# Patient Record
Sex: Female | Born: 1950 | Race: White | Hispanic: No | Smoking: Never smoker
Health system: Southern US, Academic
[De-identification: ages and names within clinical notes are randomized; demographics above are authoritative.]

## PROBLEM LIST (undated history)

## (undated) DIAGNOSIS — E785 Hyperlipidemia, unspecified: Secondary | ICD-10-CM

## (undated) DIAGNOSIS — Z974 Presence of external hearing-aid: Secondary | ICD-10-CM

## (undated) DIAGNOSIS — K5900 Constipation, unspecified: Secondary | ICD-10-CM

## (undated) DIAGNOSIS — E039 Hypothyroidism, unspecified: Secondary | ICD-10-CM

## (undated) DIAGNOSIS — E119 Type 2 diabetes mellitus without complications: Secondary | ICD-10-CM

## (undated) DIAGNOSIS — K219 Gastro-esophageal reflux disease without esophagitis: Secondary | ICD-10-CM

## (undated) DIAGNOSIS — M4800 Spinal stenosis, site unspecified: Secondary | ICD-10-CM

## (undated) DIAGNOSIS — Z8669 Personal history of other diseases of the nervous system and sense organs: Secondary | ICD-10-CM

## (undated) DIAGNOSIS — R7303 Prediabetes: Secondary | ICD-10-CM

## (undated) HISTORY — PX: HX WISDOM TEETH EXTRACTION: SHX21

## (undated) HISTORY — DX: Hyperlipidemia, unspecified: E78.5

## (undated) HISTORY — DX: Prediabetes: R73.03

## (undated) HISTORY — DX: Spinal stenosis, site unspecified: M48.00

## (undated) HISTORY — DX: Hypothyroidism, unspecified: E03.9

---

## 1994-12-27 ENCOUNTER — Other Ambulatory Visit (HOSPITAL_COMMUNITY): Payer: Self-pay | Admitting: OBSTETRICS/GYNECOLOGY

## 2007-04-10 IMAGING — MG MAMMO SCREEN W CAD
1 series · 4 of 4 positions shown · non-contrast
Comparison: Previous exams of 04/08/06 and 03/26/05.

Charlemagne, Walter Alexis
HISTORY: Asymptomatic 56 year-old with family history of breast cancer in her mother. Calculated lifetime breast cancer risk in this patient is 16% compared with 10% for control group.

[Series 2: R CC · right · 4 of 4 slices shown]
[im 1/4]
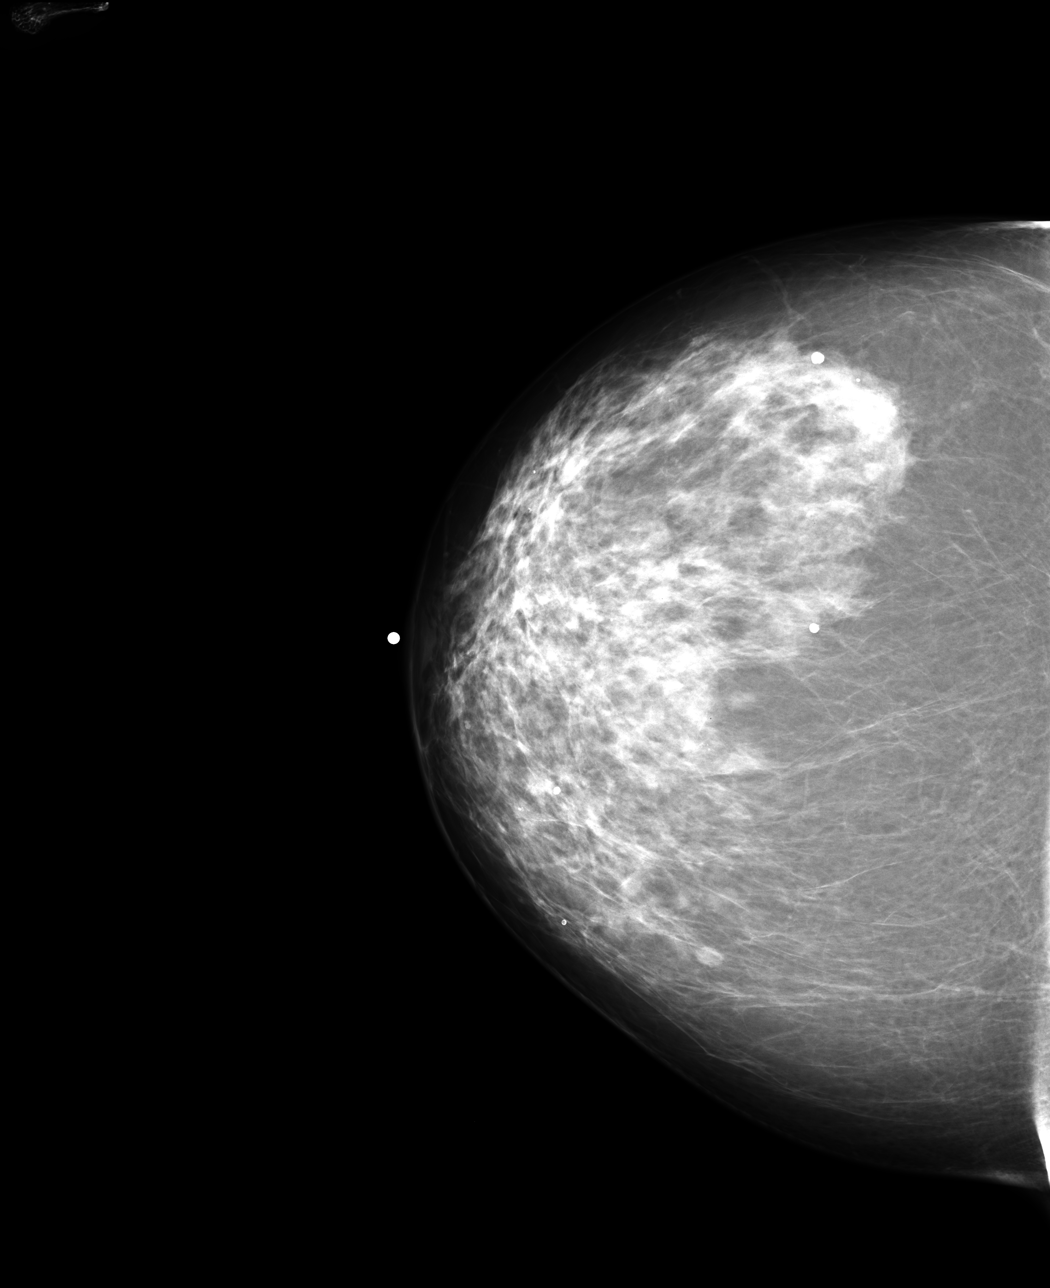
[im 2/4]
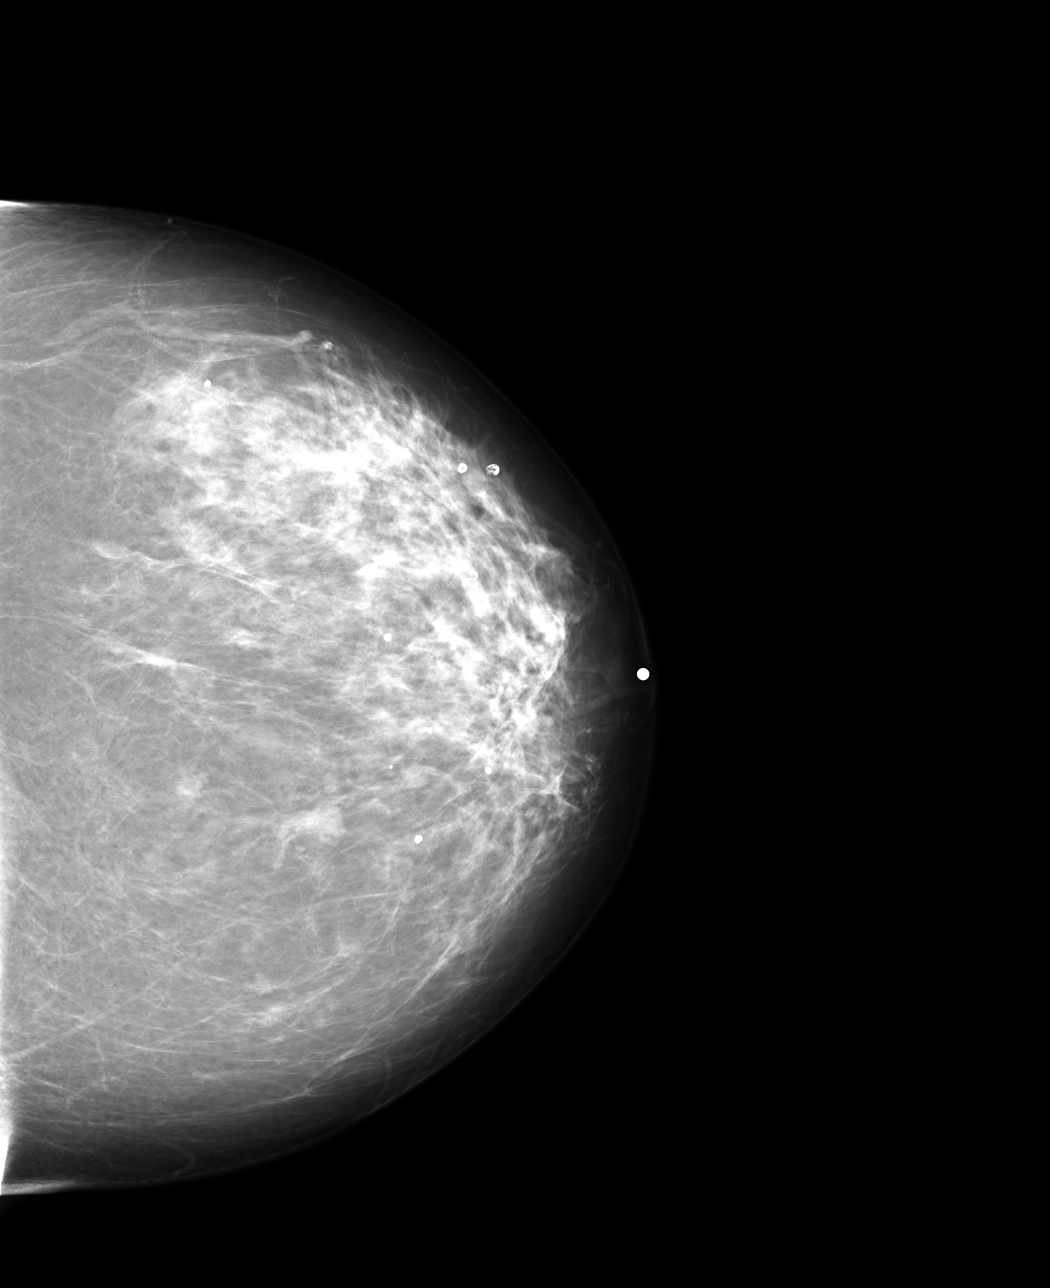
[im 3/4]
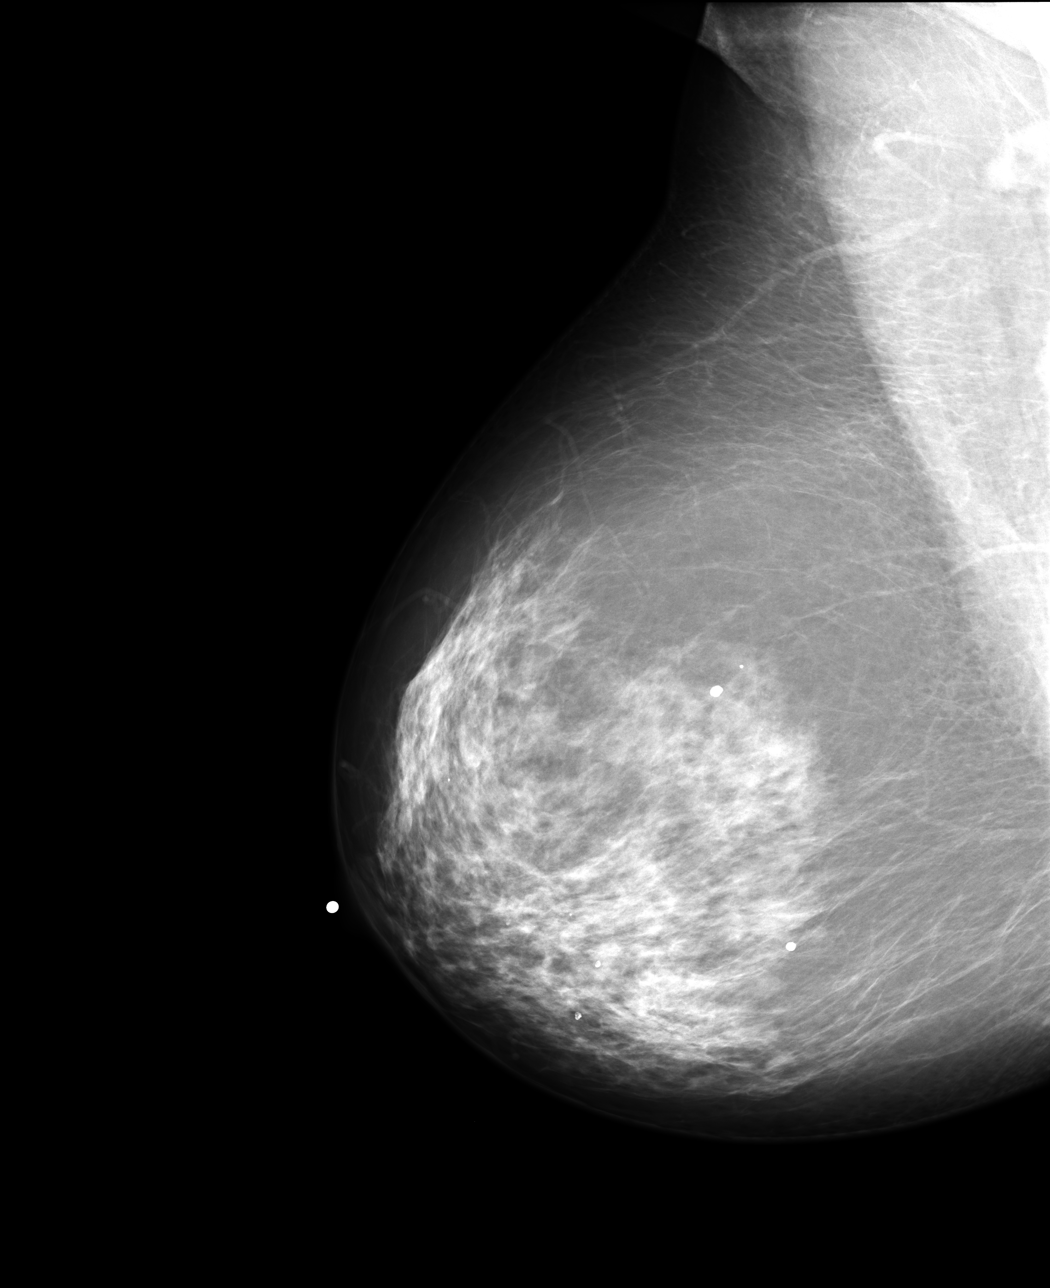
[im 4/4]
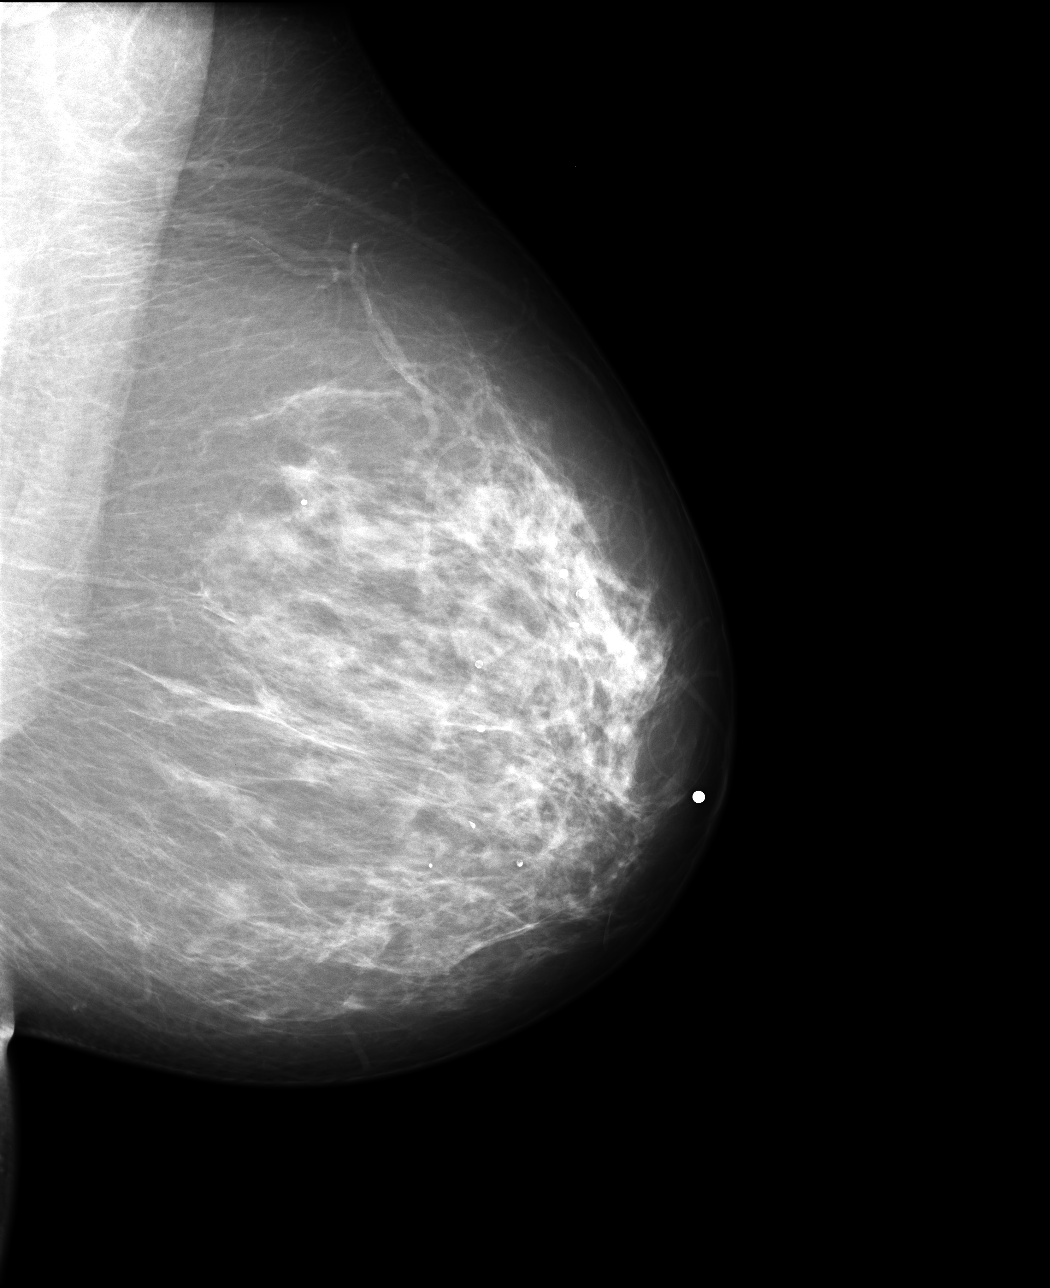

[4 of 4 positions shown; findings below may reference images not displayed]

FINDINGS: No well defined masses or architectural changes are seen. Benign calcific densities of both breasts are stable in appearance. No skin changes or nipple changes are seen. 
NOTE:
In compliance with Federal regulations, the results of this mammogram are being sent to the patient.
IMPRESSION: Stable mammographic findings including scattered microcalcifications in both breasts. 
Clinical follow up and mammographic follow up at twelve months are recommended. 

Final Assessment Code:
Bi-Rads 2 

BI-RADS 0
Need additional imaging evaluation
BI-RADS 1
Negative mammogram
BI-RADS 2
Benign finding
BI-RADS 3
Probably benign finding: short-interval follow-up suggested
BI-RADS 4
Suspicious abnormality:  biopsy should be considered
BI-RADS 5
Highly suggestive of malignancy; appropriate action should be taken

________________________________
Junihato Grandson., signed this document electronically

## 2008-04-10 IMAGING — MG MAMMO SCREEN W CAD
1 series · 4 of 4 positions shown · non-contrast
Comparison: none

Achurra, Patricioo
HISTORY: Screening.
TECHNIQUE: Digital screening mammography was performed with CAD.

[Series 2: R CC · right · 4 of 4 slices shown]
[im 1/4]
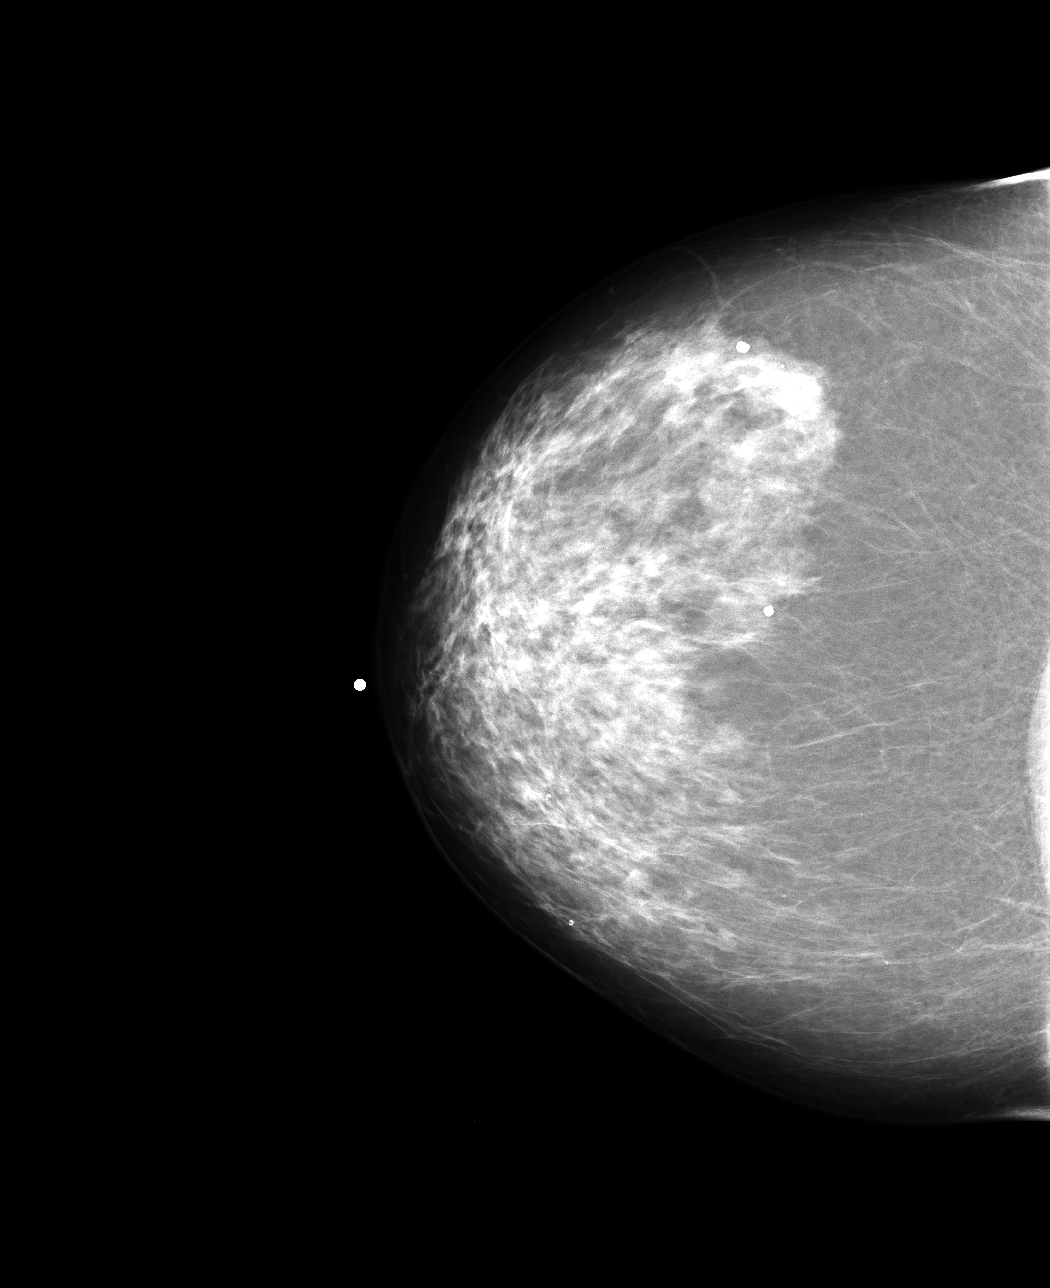
[im 2/4]
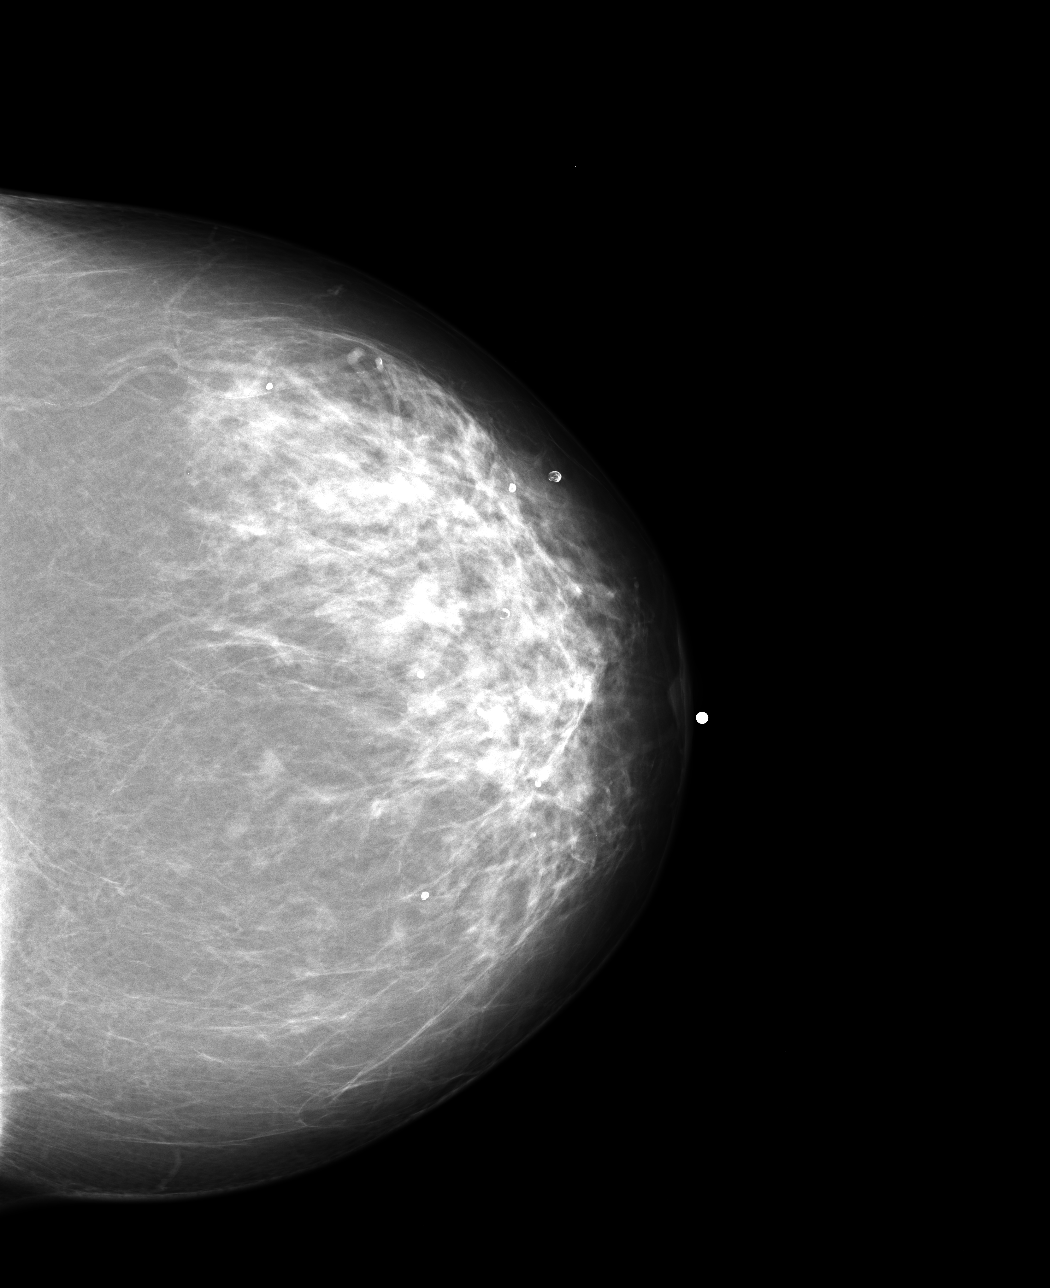
[im 3/4]
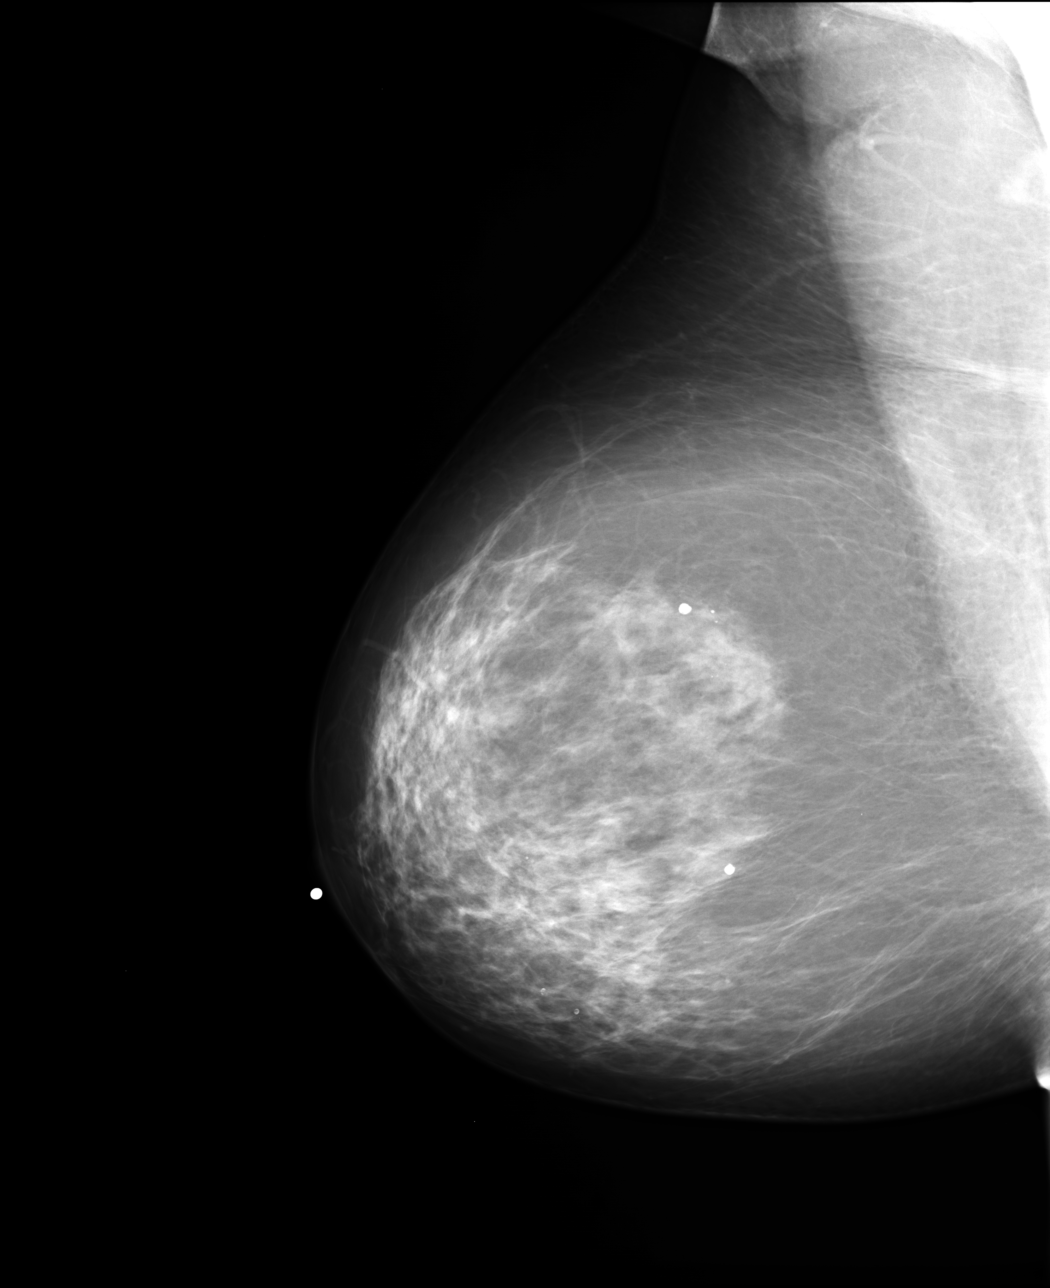
[im 4/4]
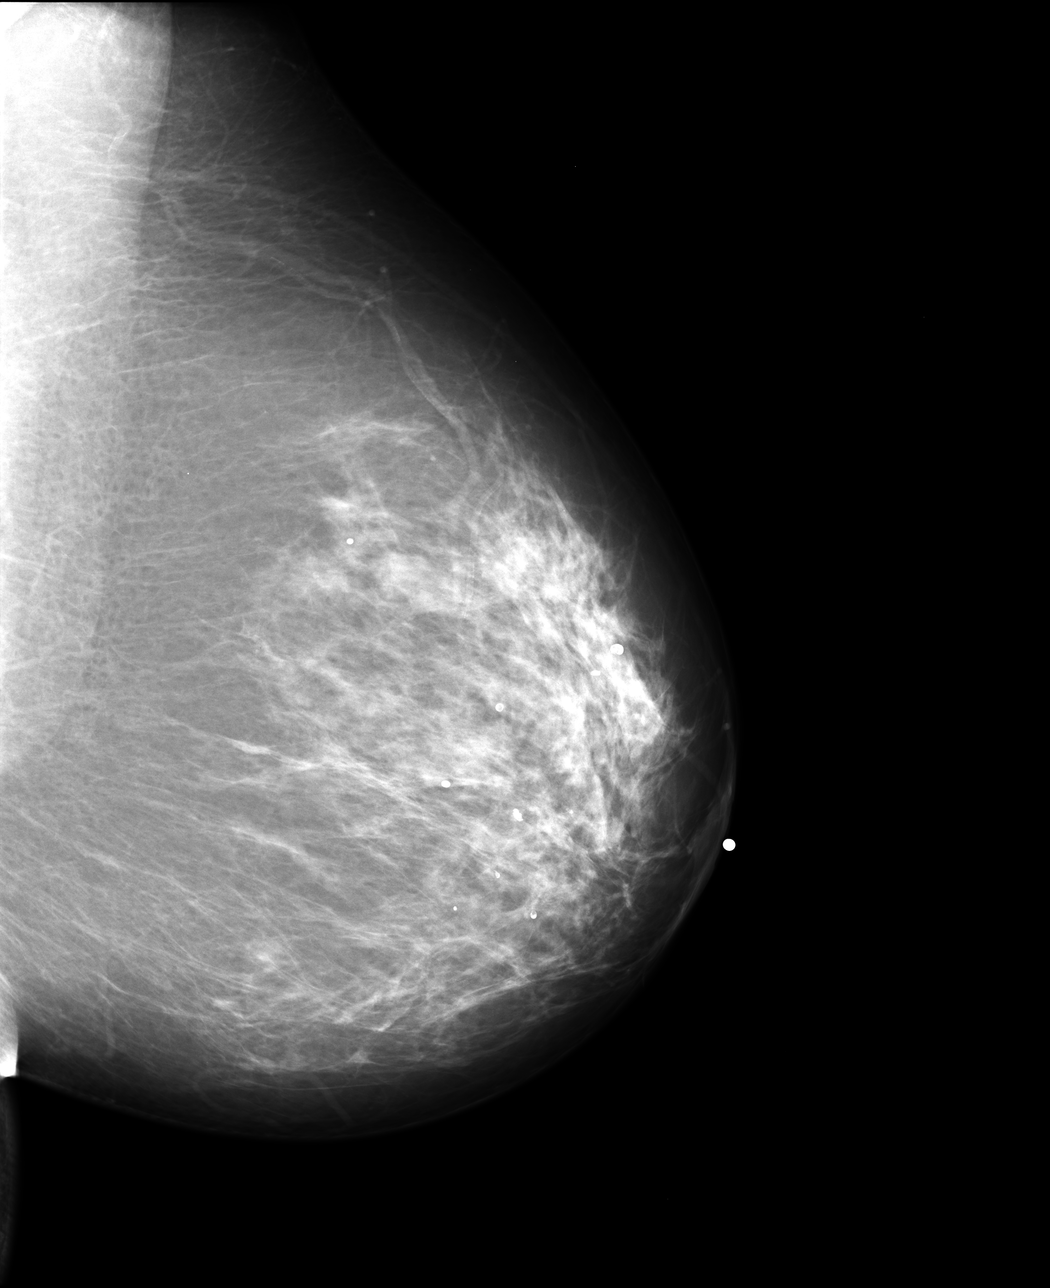

[4 of 4 positions shown; findings below may reference images not displayed]

FINDINGS: There is a moderate amount of heterogeneous parenchyma seen in a roughly symmetrical distribution.  Benign-appearing calcifications are noted bilaterally.  No malignant-appearing masses, spiculated lesions, or suspicious microcalcifications are seen.  No significant change is evident compared to prior films dated 04/10/2007.

NOTE:
In compliance with Federal regulations, the results of this mammogram are being sent to the patient.
IMPRESSION: No radiographic evidence of malignancy

Final Assessment Code:  BI-RADS 2-Benign.

BI-RADS 0
Need additional imaging evaluation
BI-RADS 1
Negative mammogram
BI-RADS 2
Benign finding
BI-RADS 3
Probably benign finding - short interval follow-up suggested
BI-RADS 4
Suspicious abnormality: biopsy should be considered
BI-RADS 5
Highly suggestive of malignancy; appropriate action should be taken

________________________________

## 2009-04-11 IMAGING — MG MAMMO SCREEN W CAD
1 series · 4 of 4 positions shown · non-contrast
Comparison: none

Atenas, Katherine Patricia

EXAM:
DIGITAL SCREENING BILATERAL MAMMOGRAM WITHOUT CONTRAST
HISTORY: Screening.
TECHNIQUE: Digital screening mammography was performed with CAD.

[Series 2: R CC · right · 4 of 4 slices shown]
[im 1/4]
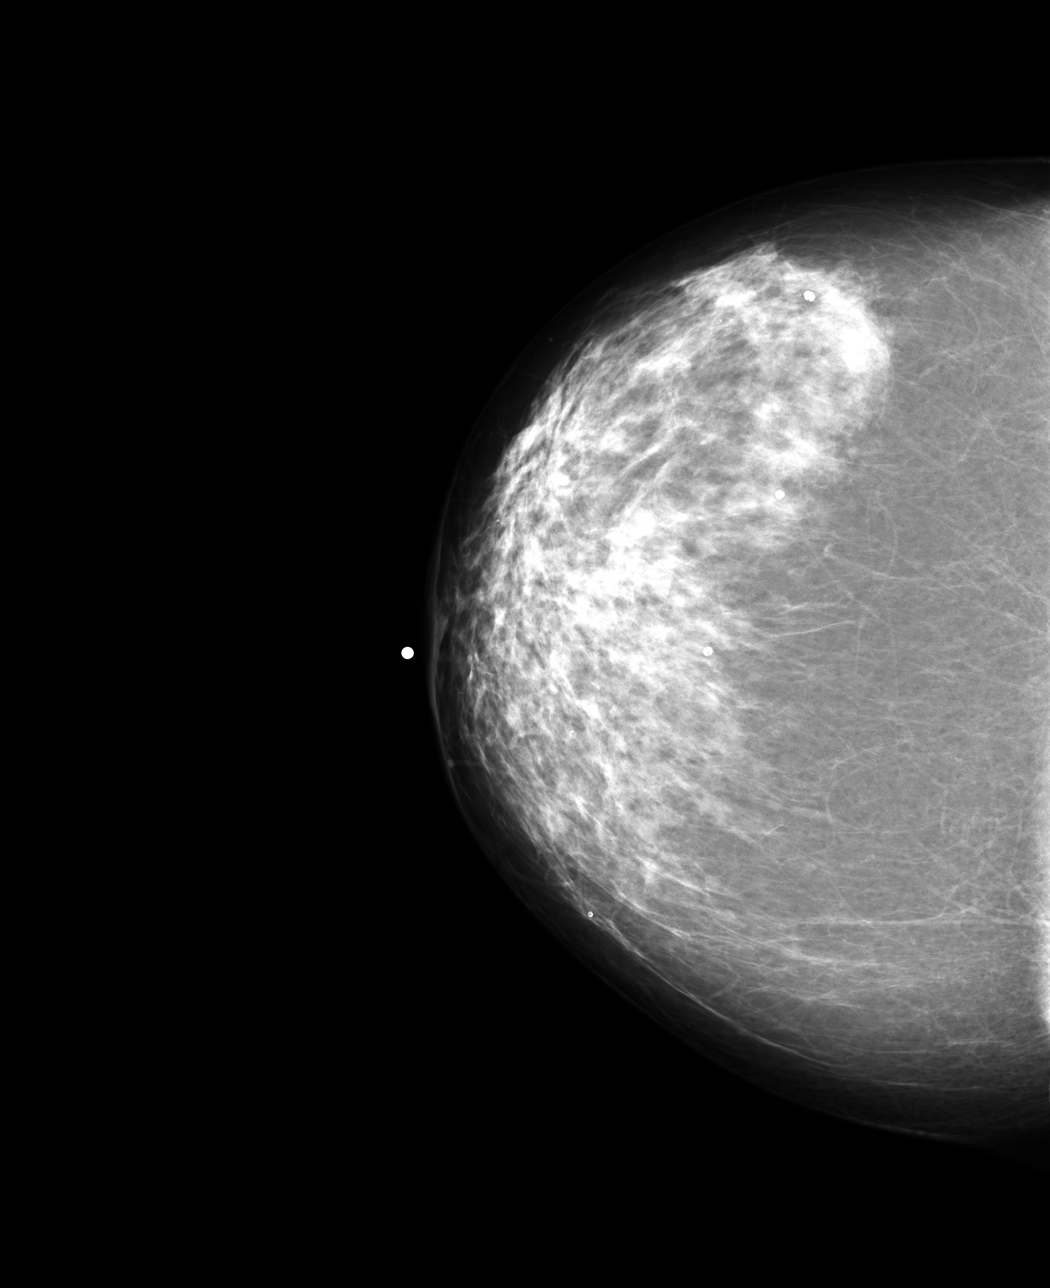
[im 2/4]
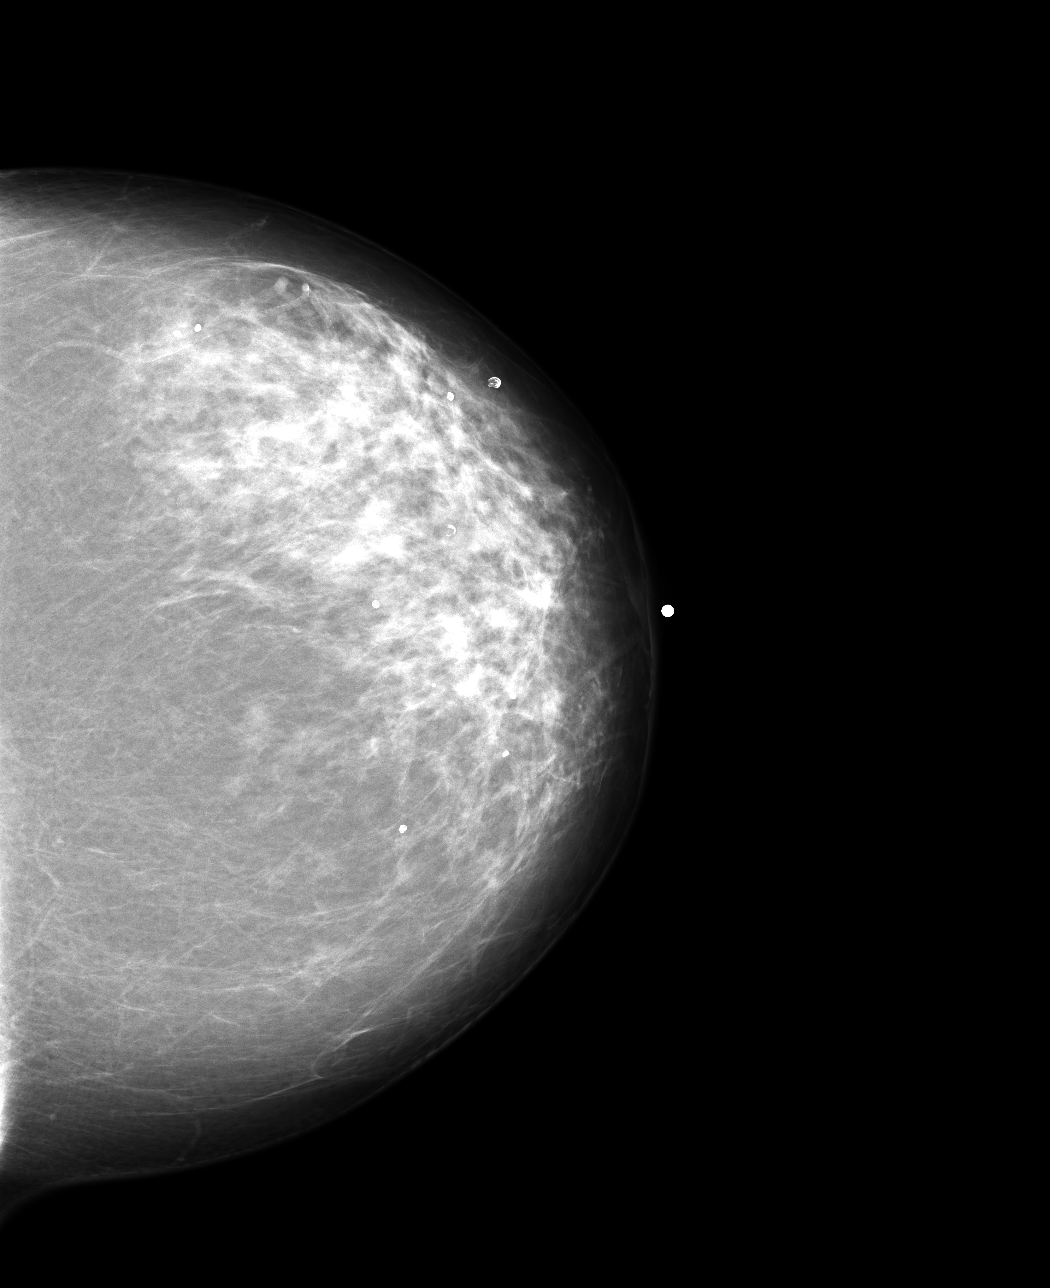
[im 3/4]
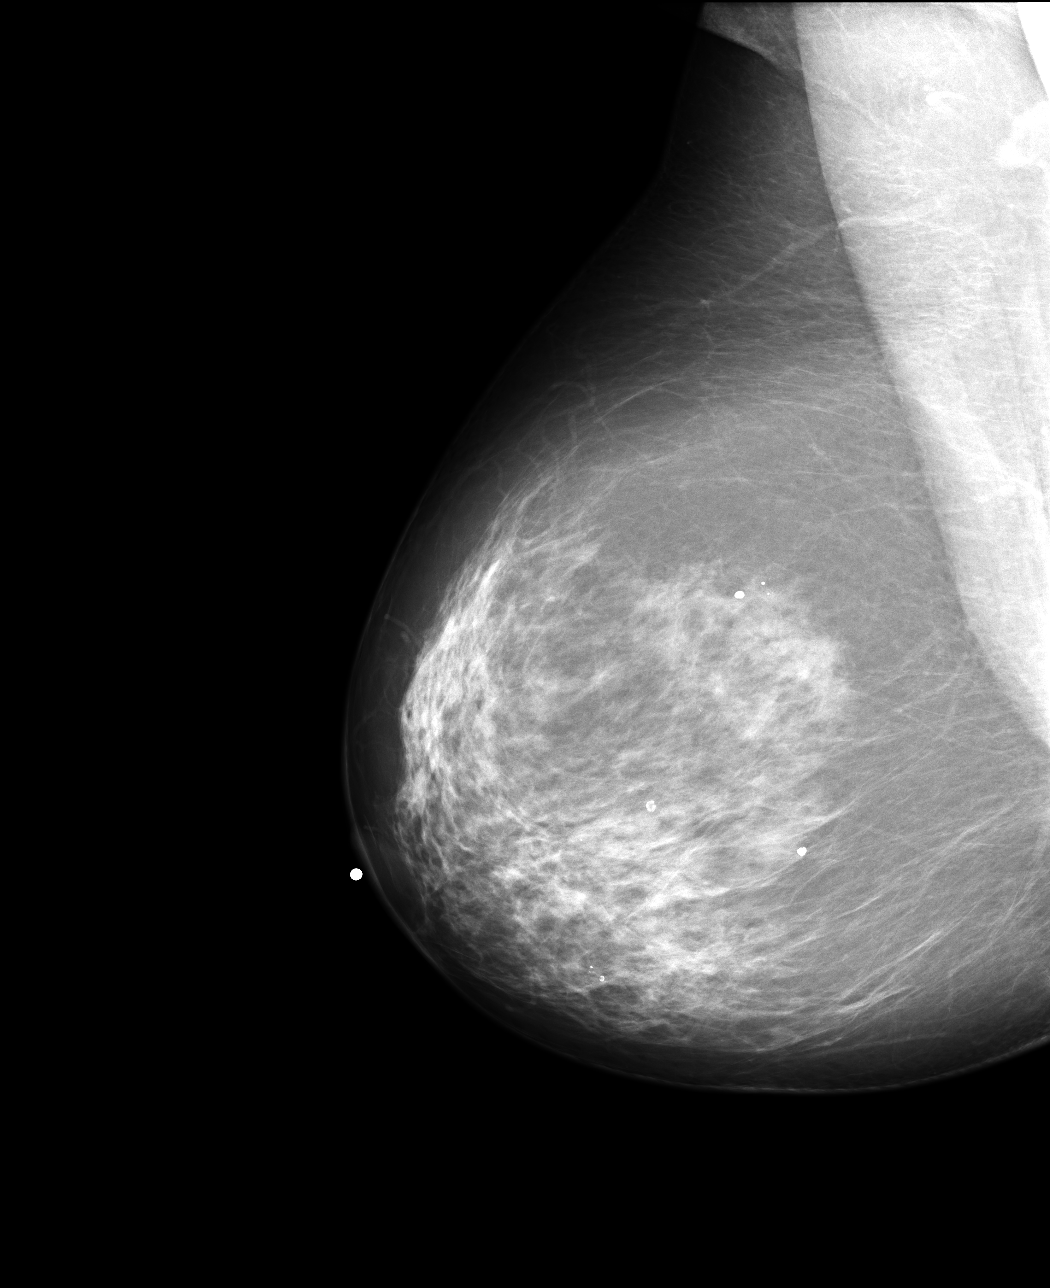
[im 4/4]
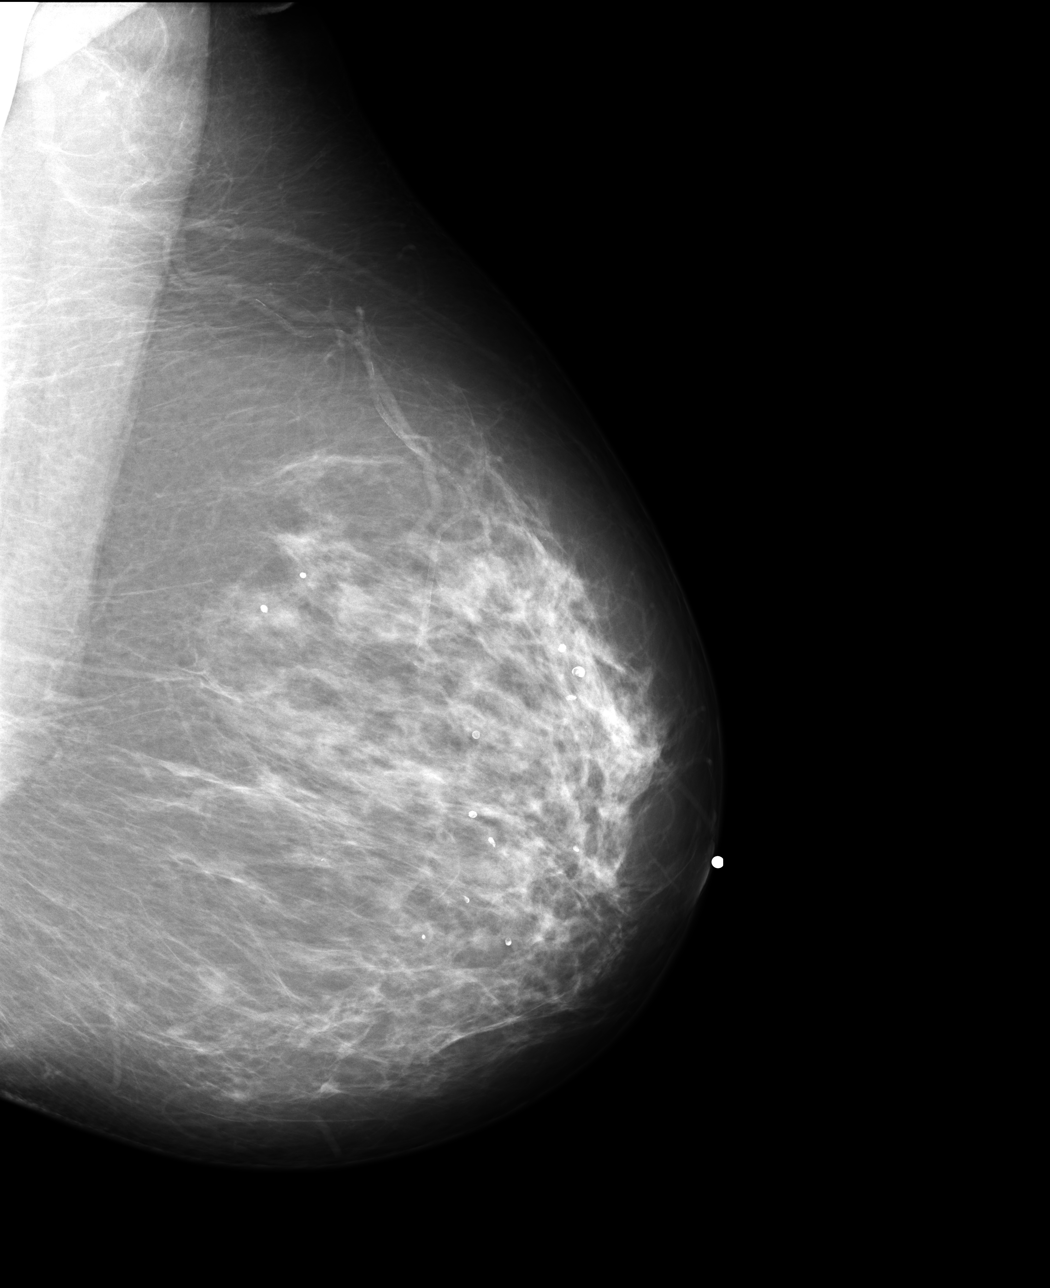

[4 of 4 positions shown; findings below may reference images not displayed]

FINDINGS: There is a moderate amount of heterogeneous parenchyma seen in a roughly symmetrical distribution.  Benign-appearing calcifications are noted bilaterally.  No malignant-appearing masses, spiculated lesions, or suspicious microcalcifications are seen.  No significant change is evident compared to prior films dated 04/10/2008.    

NOTE:

In compliance with Federal regulations, the results of this mammogram are being sent to the patient.
IMPRESSION: No radiographic evidence of malignancy.

Final Assessment Code:

BI-RADS 2:
BI-RADS 0
Need additional imaging evaluation
BI-RADS 1
Negative mammogram
BI-RADS 2
Benign finding
BI-RADS 3
Probably benign finding - short interval follow-up suggested
BI-RADS 4
Suspicious abnormality: biopsy should be considered
BI-RADS 5
Highly suggestive of malignancy; appropriate action should be taken

________________________________

## 2010-04-16 IMAGING — MG MAMMO SCREEN W CAD
1 series · 4 of 4 positions shown · non-contrast
Comparison: 04/10/08 and 04/11/09.

Ratamaa, Erkki
HISTORY: Asymptomatic 59-year old female with family history of breast cancer in her mother.  Lifetime breast cancer risk is calculated at 15% compared with 10% in controlled group.

[Series 2: R CC · right · 4 of 4 slices shown]
[im 1/4]
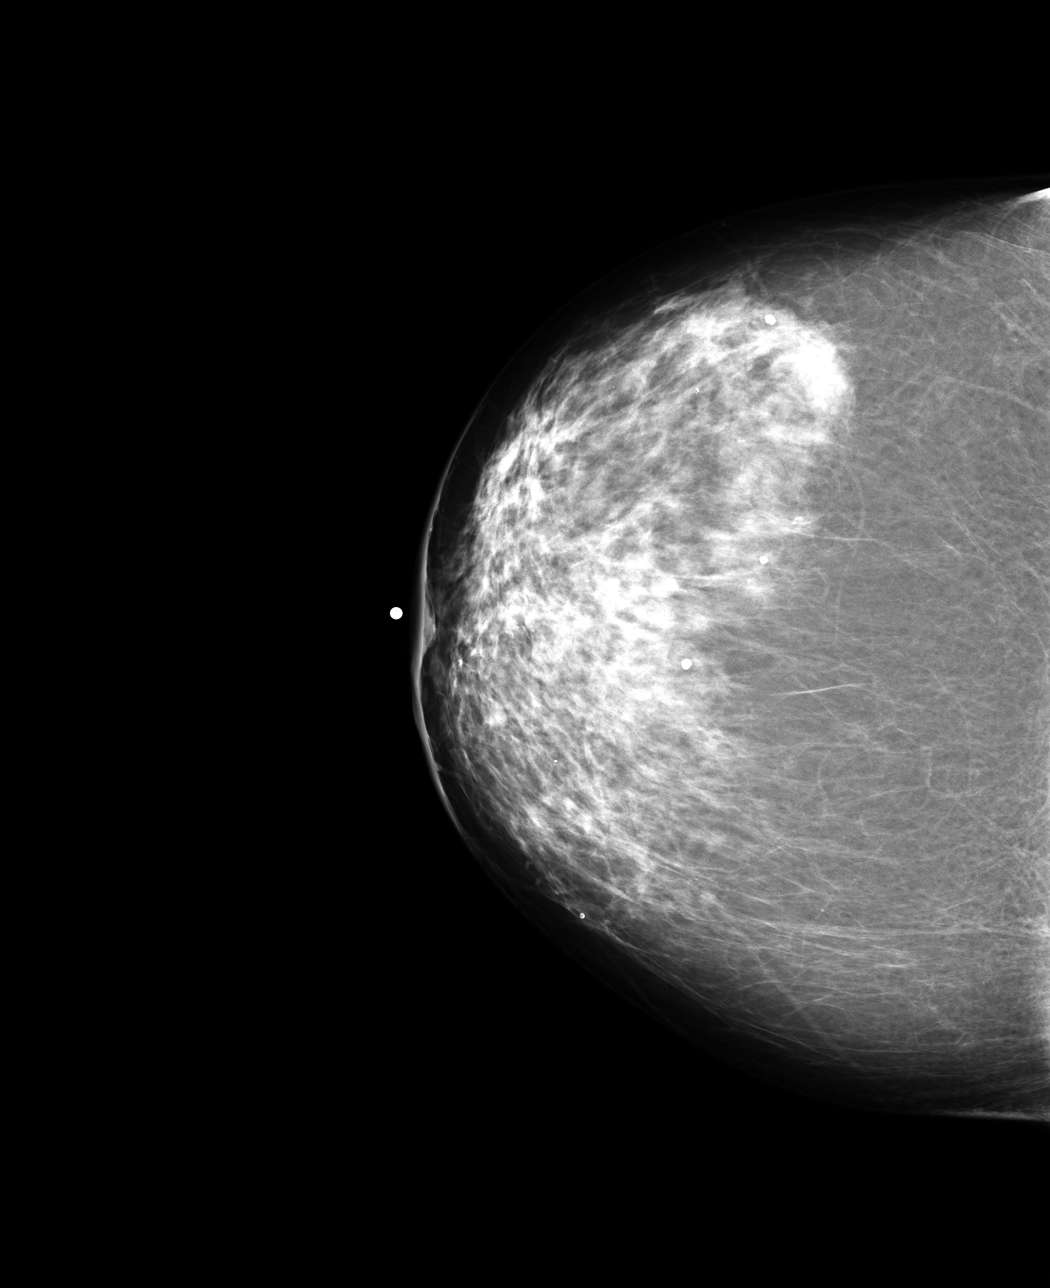
[im 2/4]
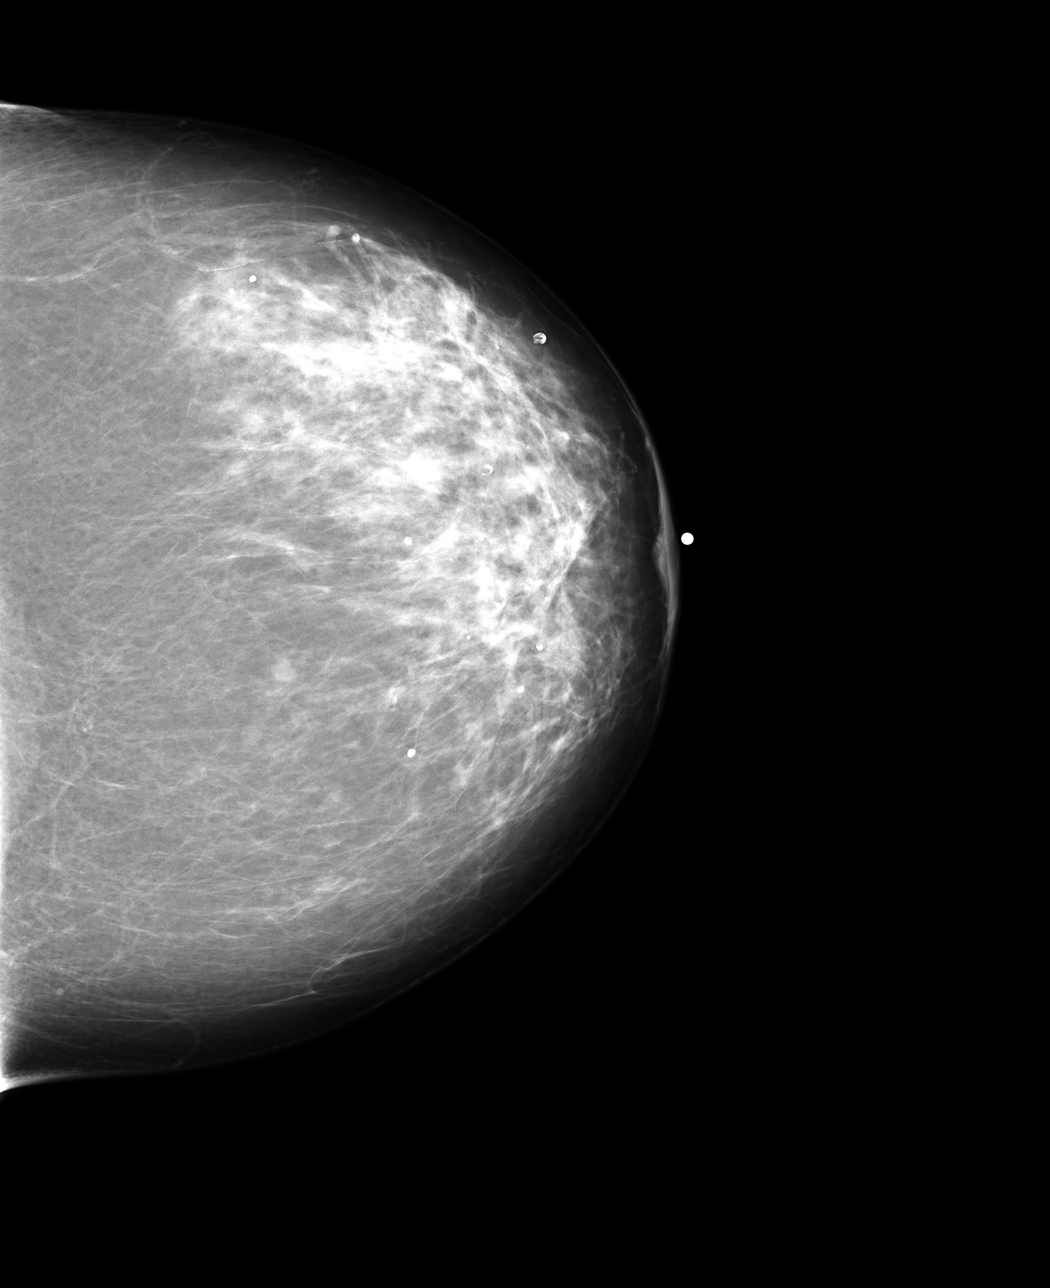
[im 3/4]
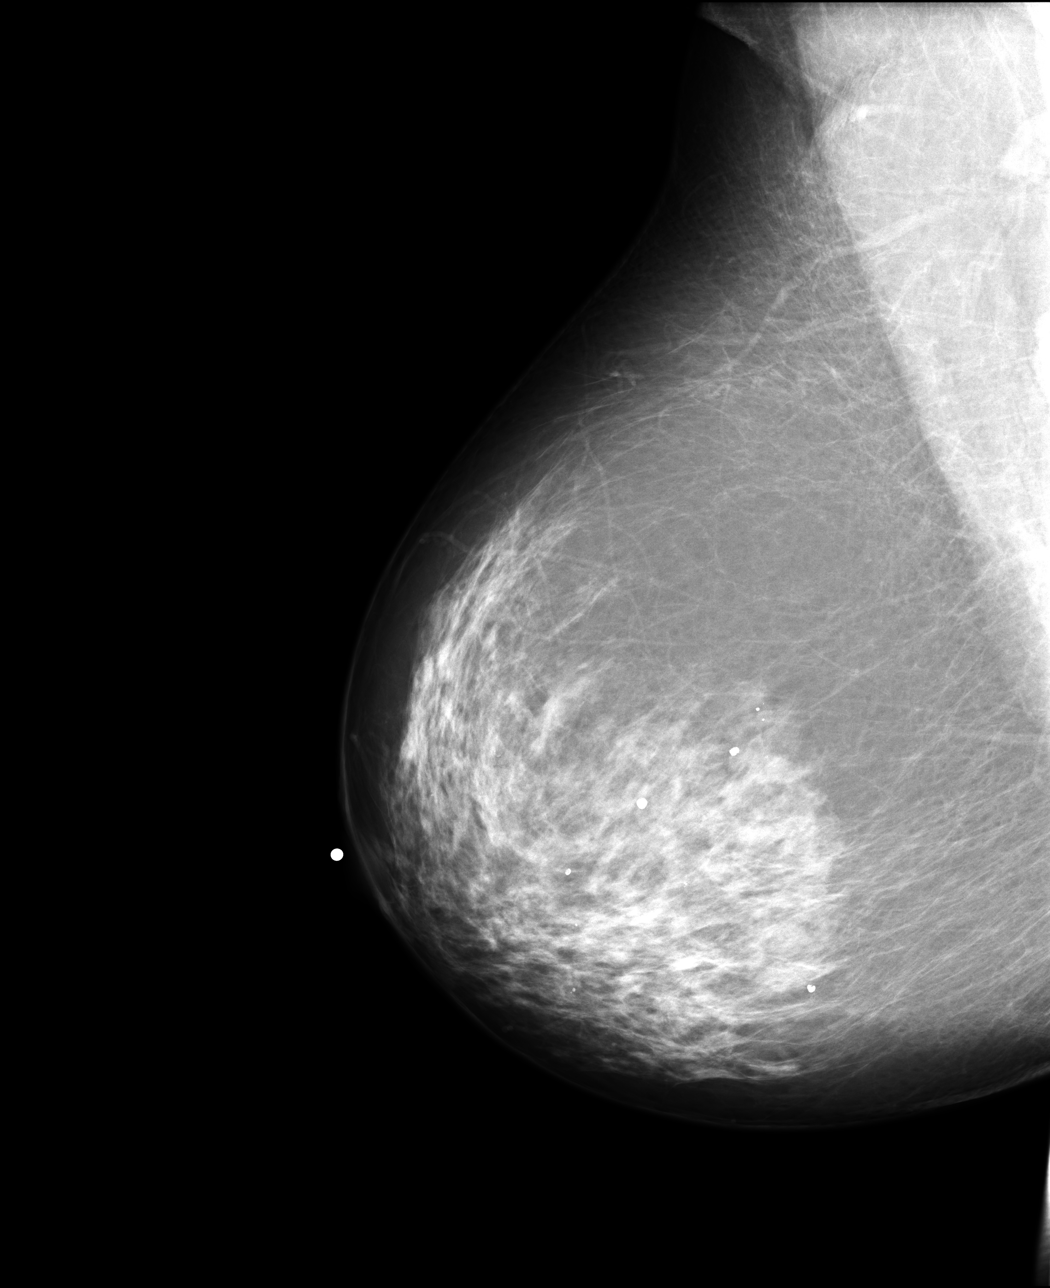
[im 4/4]
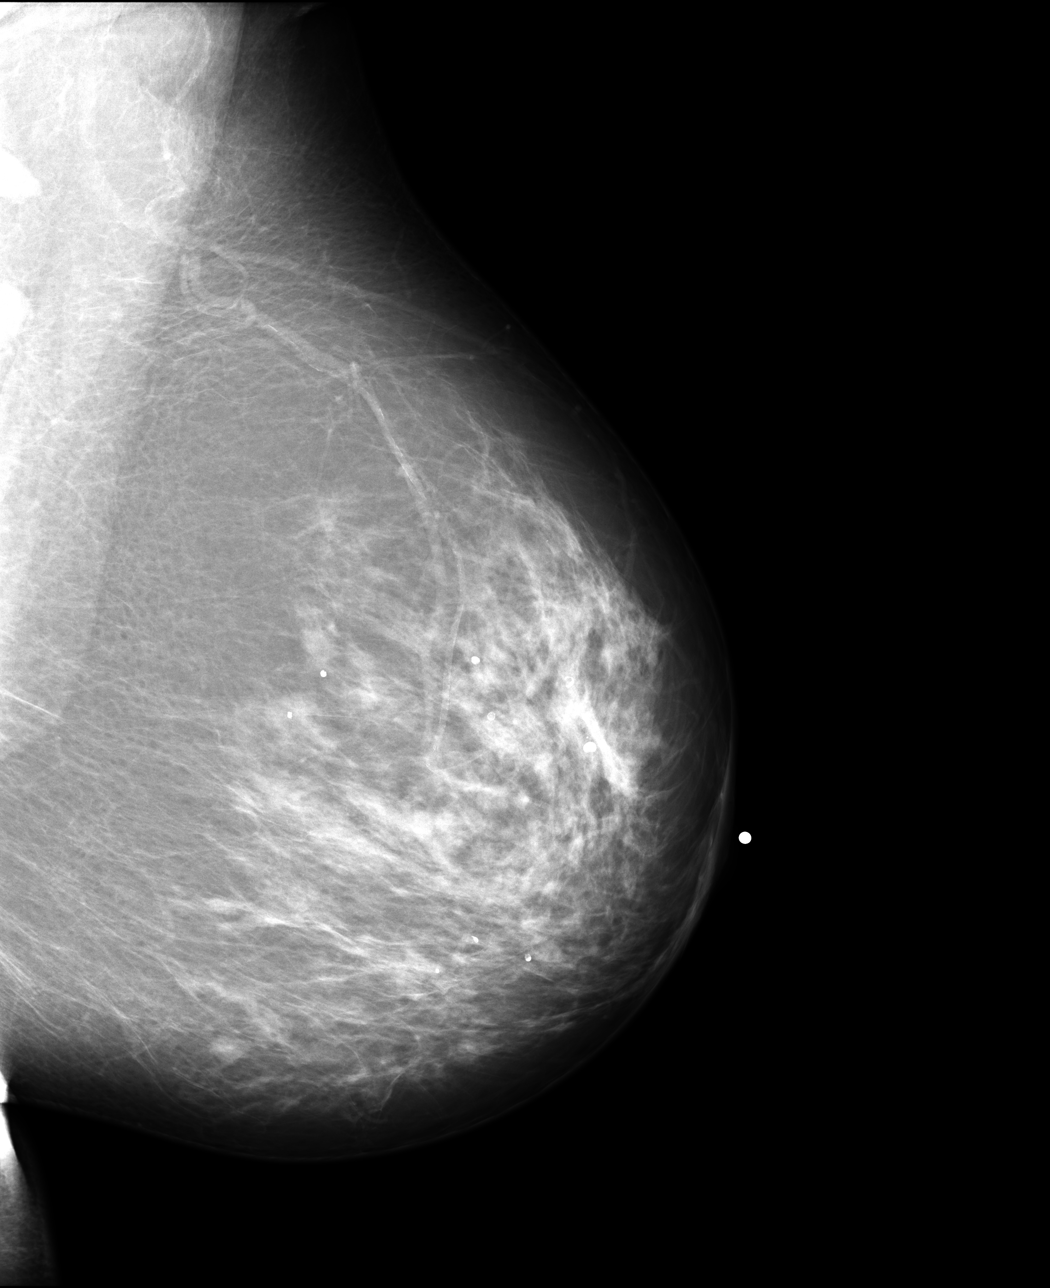

[4 of 4 positions shown; findings below may reference images not displayed]

FINDINGS: No focal masses are seen.  Scattered microcalcific foci of both breasts are stable.  Small focal asymmetry in the upper lateral right breast is also stable in appearance.  No skin changes or nipple changes are seen.  
NOTE:
In compliance with Federal regulations, the results of this mammogram are being sent to the patient.
IMPRESSION: 1. Stable mammographic findings including benign microcalcific foci of both breasts and a small focal asymmetry in the lateral right breast.  Clinical and mammographic follow up are indicated at 12 months.  
Final Assessment Code:
Bi-Rads 2 

BI-RADS 0
Need additional imaging evaluation
BI-RADS 1
Negative mammogram
BI-RADS 2
Benign finding
BI-RADS 3
Probably benign finding: short-interval follow-up suggested
BI-RADS 4
Suspicious abnormality:  biopsy should be considered
BI-RADS 5
Highly suggestive of malignancy; appropriate action should be taken

________________________________
Pere Antoni Mazon., signed this document electronically

## 2011-04-20 IMAGING — MG MAMMO SCREEN W CAD
1 series · 4 of 4 positions shown · non-contrast
Comparison: Mammography from March 2010 and March 2009.

Ayuso, Juvencio

Exam:
Screening digital mammogram with CAD
INDICATION: Annual.

[Series 2: R CC · right · 4 of 4 slices shown]
[im 1/4]
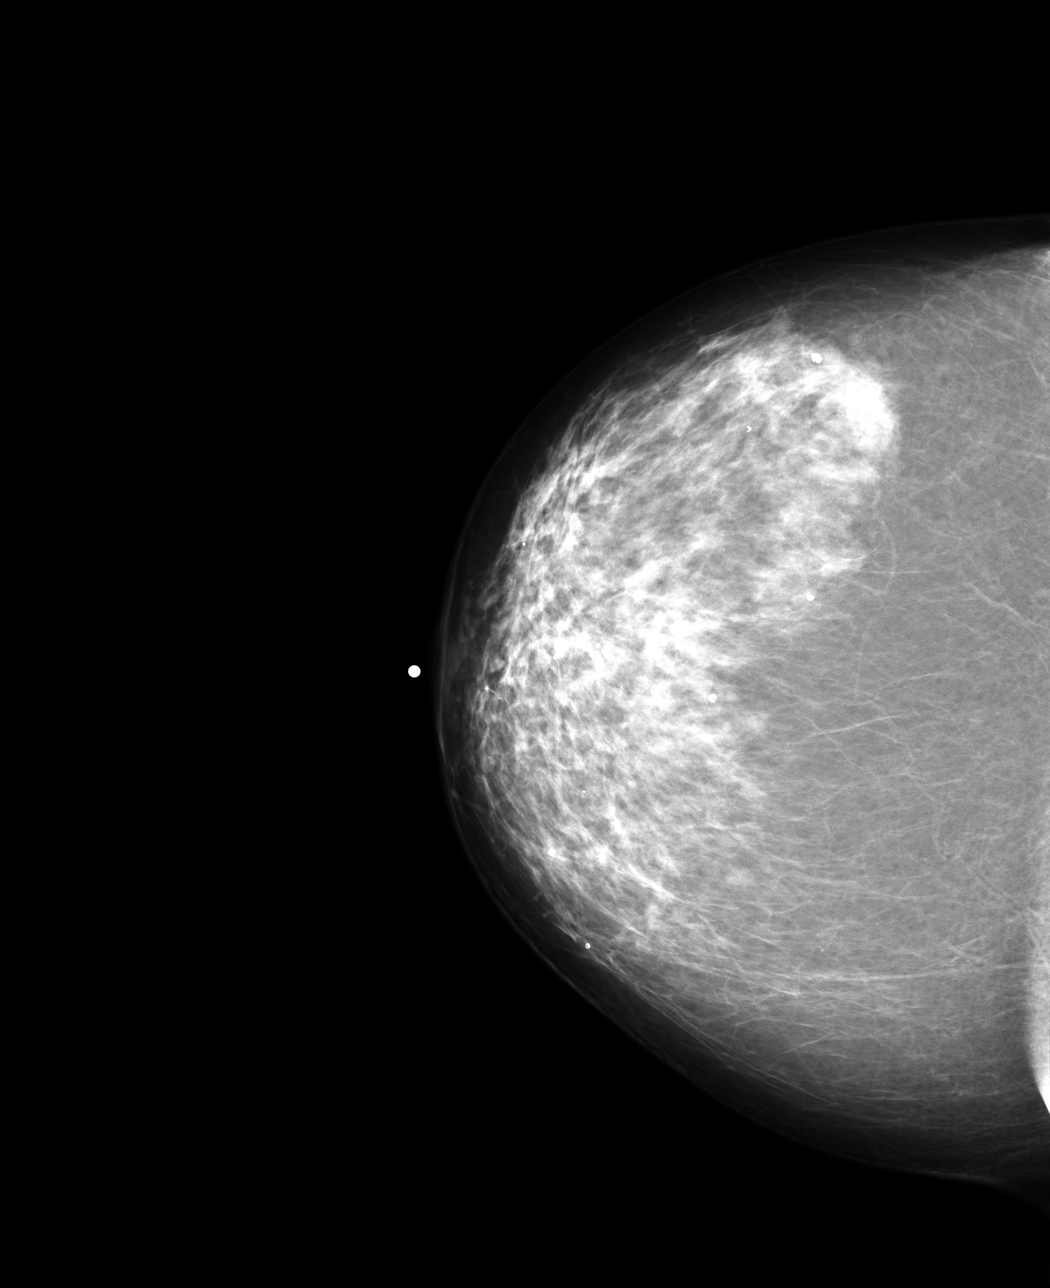
[im 2/4]
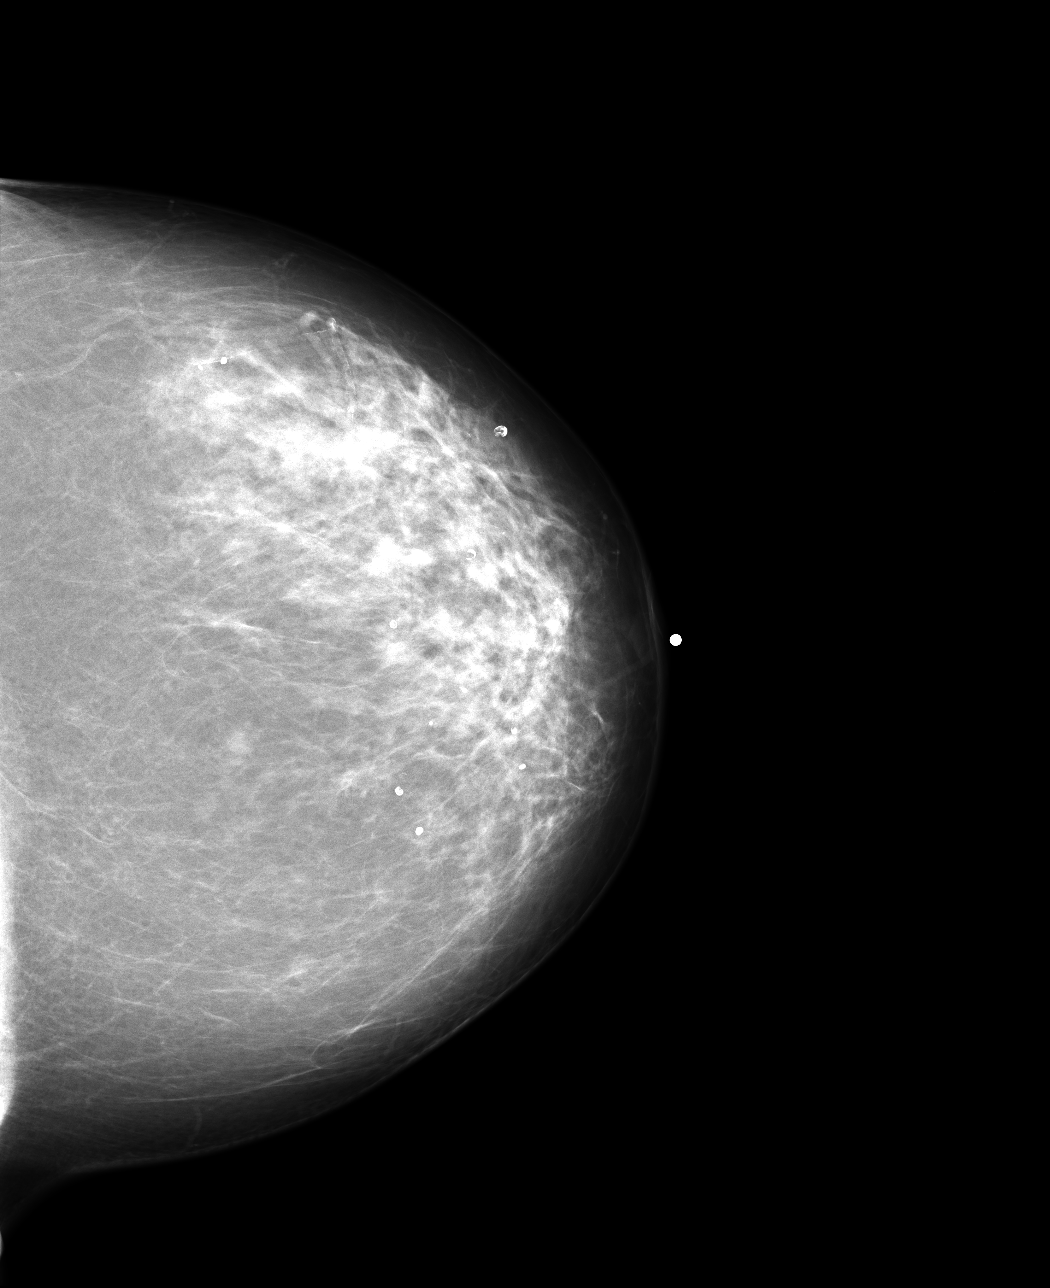
[im 3/4]
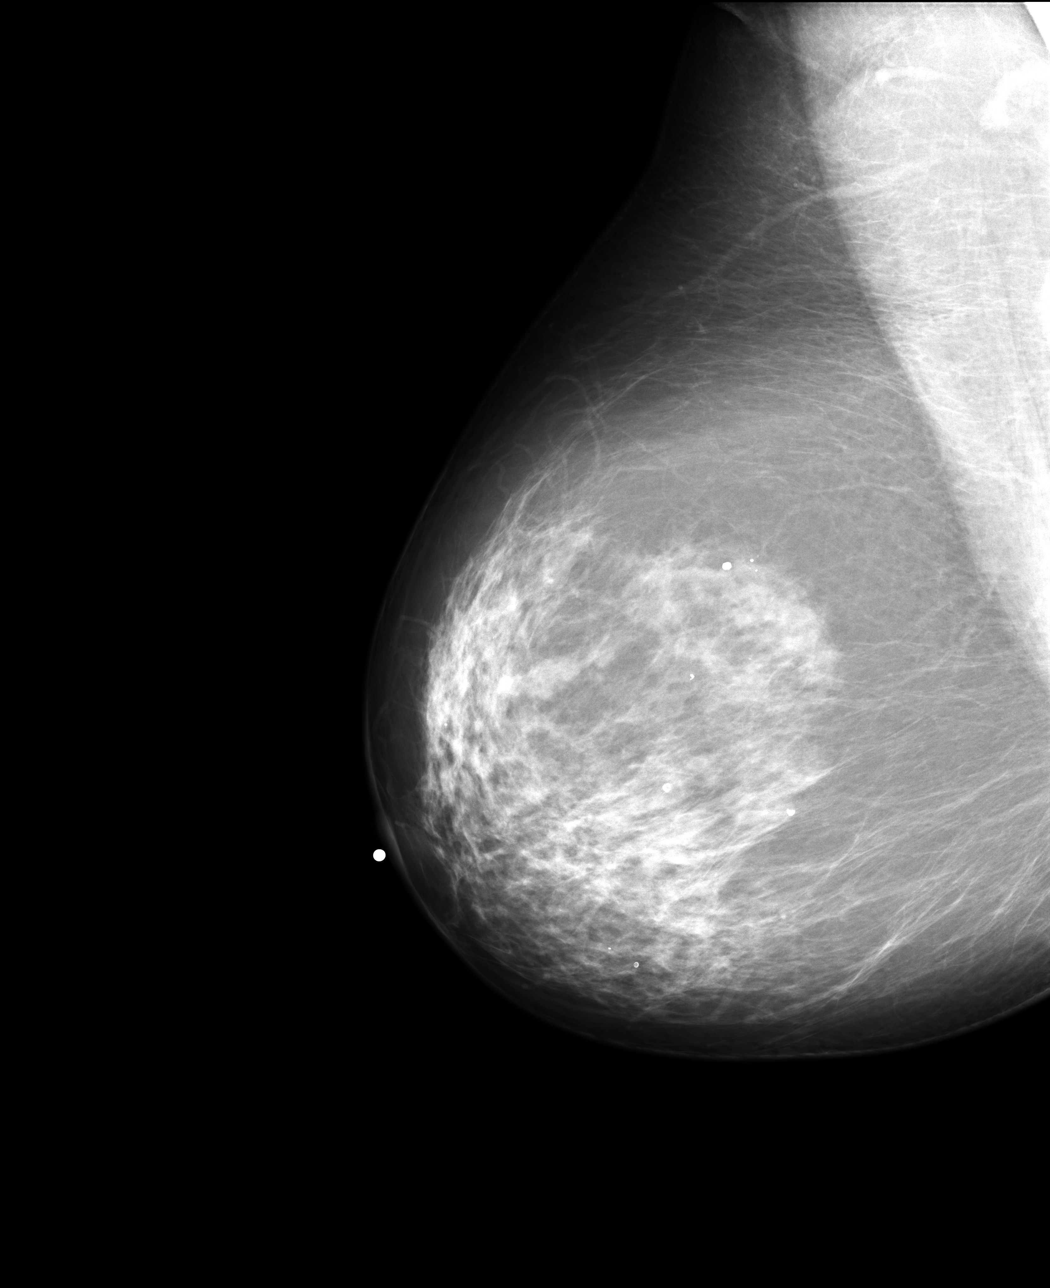
[im 4/4]
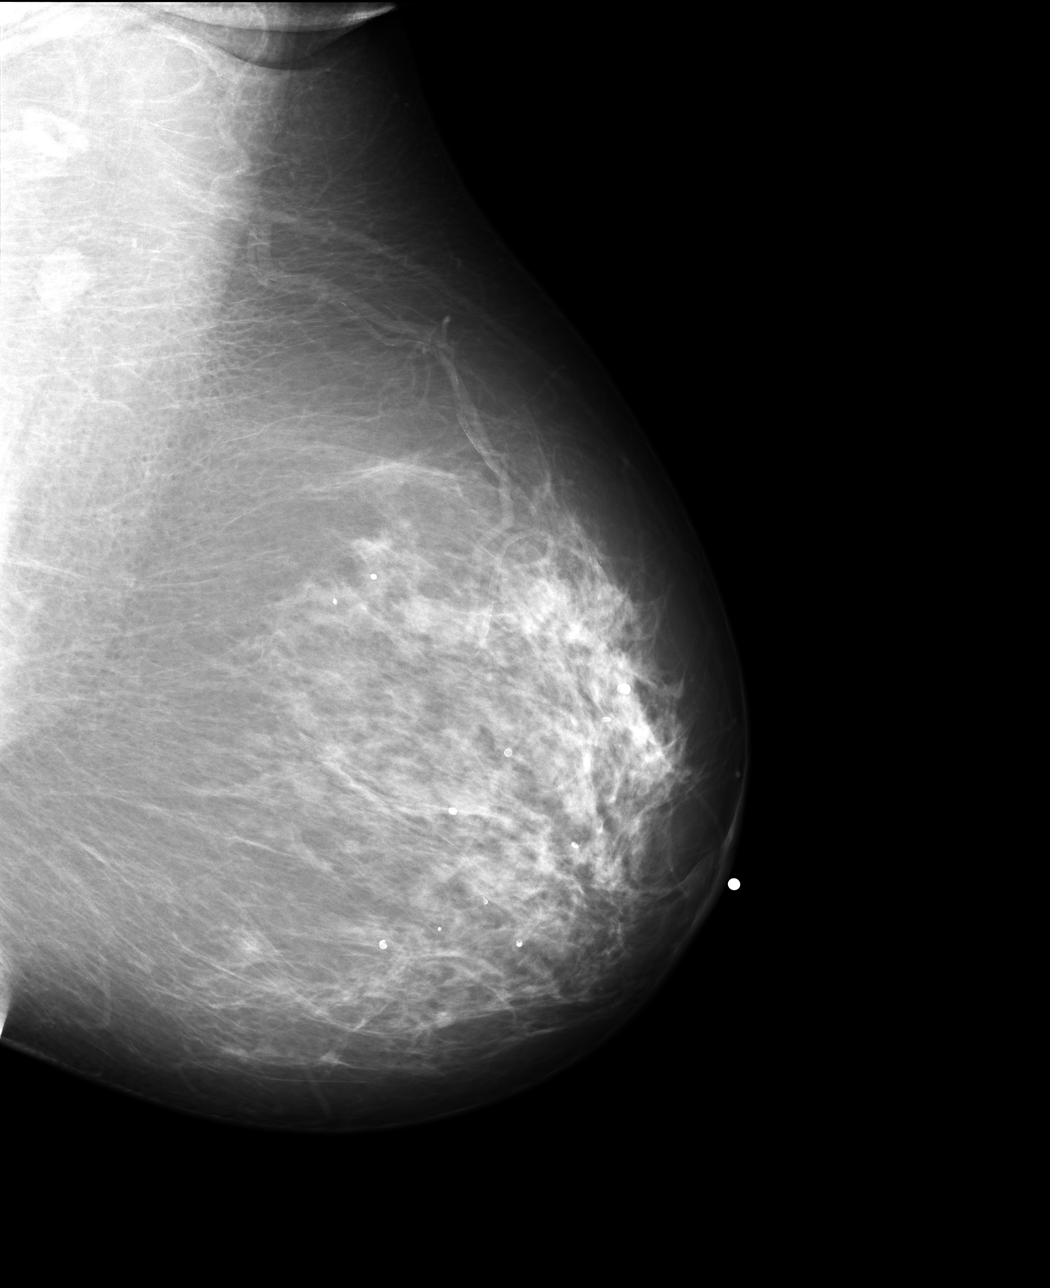

[4 of 4 positions shown; findings below may reference images not displayed]

FINDINGS: Breasts contain scattered fibroglandular elements. There is no suspicious mass or asymmetry. Stable calcification seen in the right breast. No new skin thickening or nipple retraction noted. 

NOTE:

In compliance with Federal regulations, the results of this mammogram are being sent to the patient.
IMPRESSION: Bi-Rads 2-Benign findings. 
RECOMMENDATION: Resume annual screening. 
Final Assessment Code:
Bi-Rads 2 

BI-RADS 0
Need additional imaging evaluation
BI-RADS 1
Negative mammogram
BI-RADS 2
Benign finding
BI-RADS 3
Probably benign finding: short-interval follow-up suggested
BI-RADS 4
Suspicious abnormality:  biopsy should be considered
BI-RADS 5
Highly suggestive of malignancy; appropriate action should be taken

________________________________
Jumper, Bella., signed this document electronically

## 2019-04-05 IMAGING — US CAROTID US W/DOPPLER
1 series · 14 of 24 positions shown · non-contrast
Comparison: None available.

EXAM:  CAROTID US W/DOPPLER
INDICATION: Right-sided bruit.

[Series 4: carotid us w/doppler · 0.06mm/px · 14 of 65 slices shown]
[im 1/65]
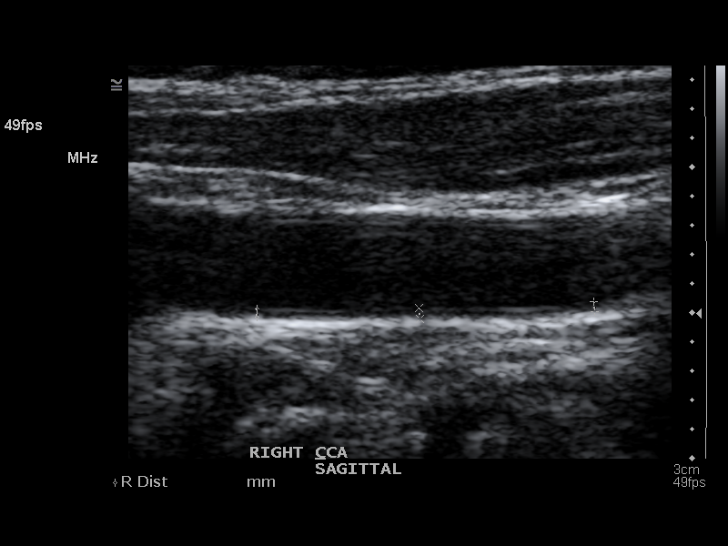
[im 6/65]
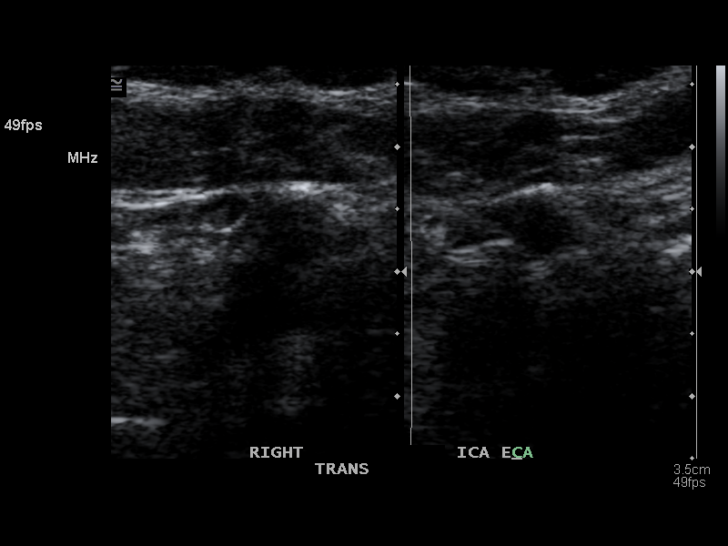
[im 12/65]
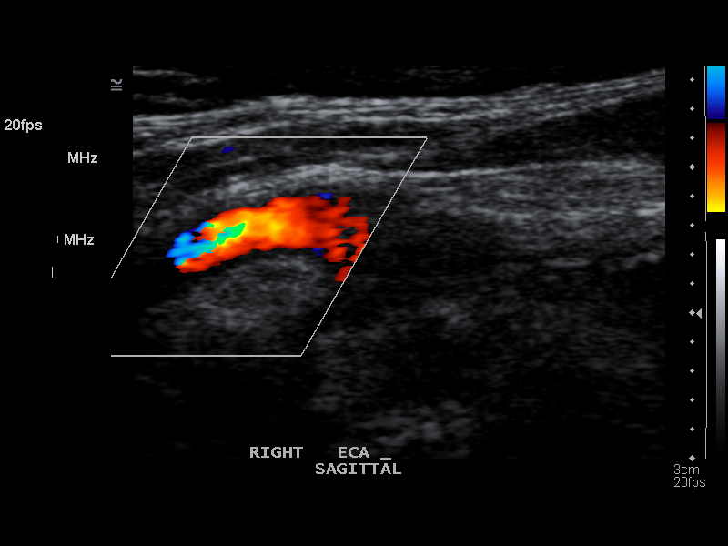
[im 17/65]
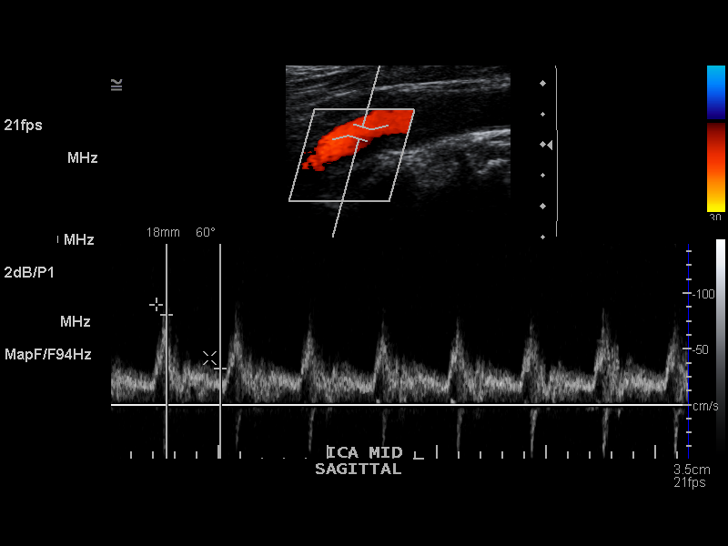
[im 20/65]
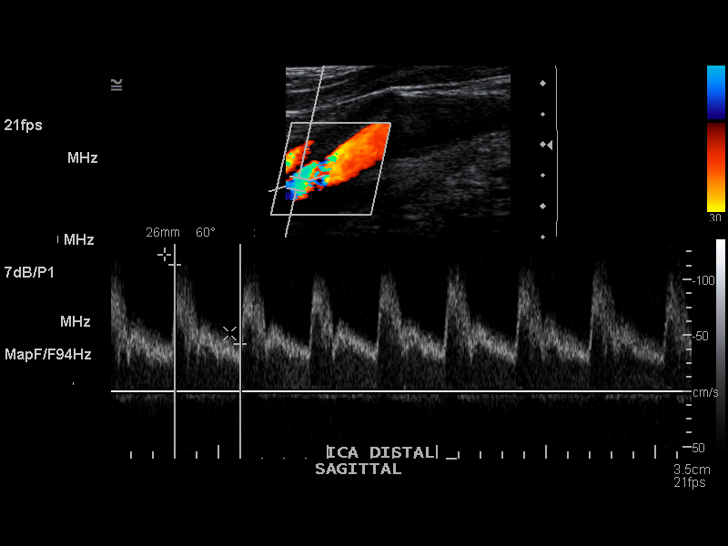
[im 26/65]
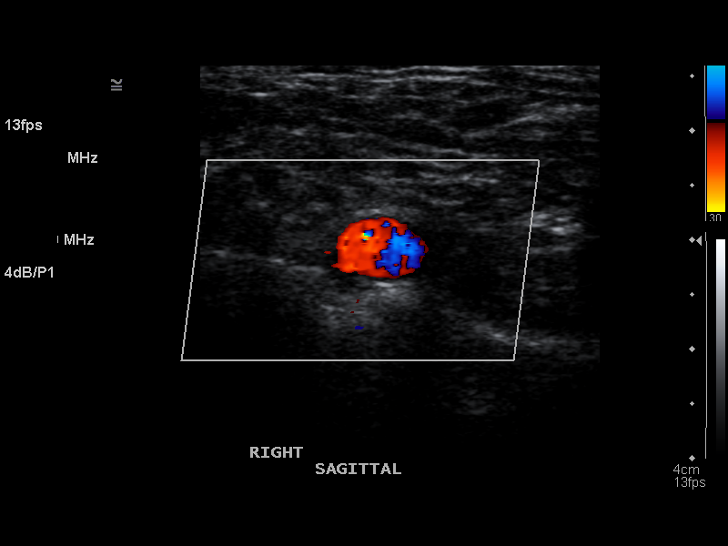
[im 31/65]
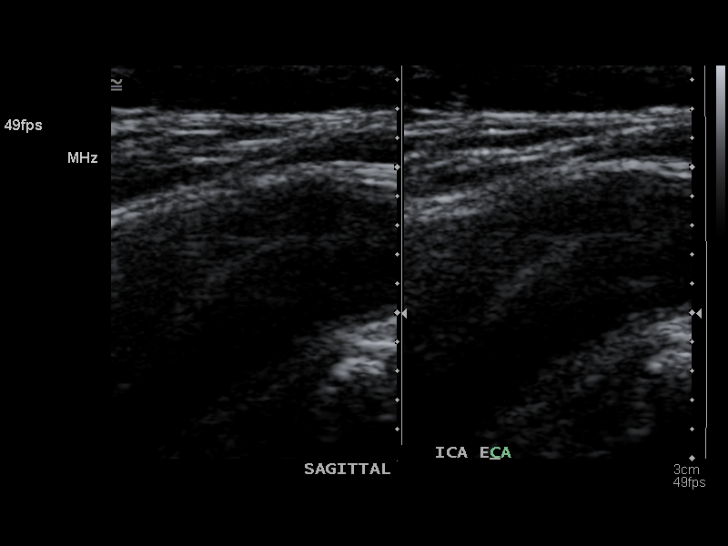
[im 34/65]
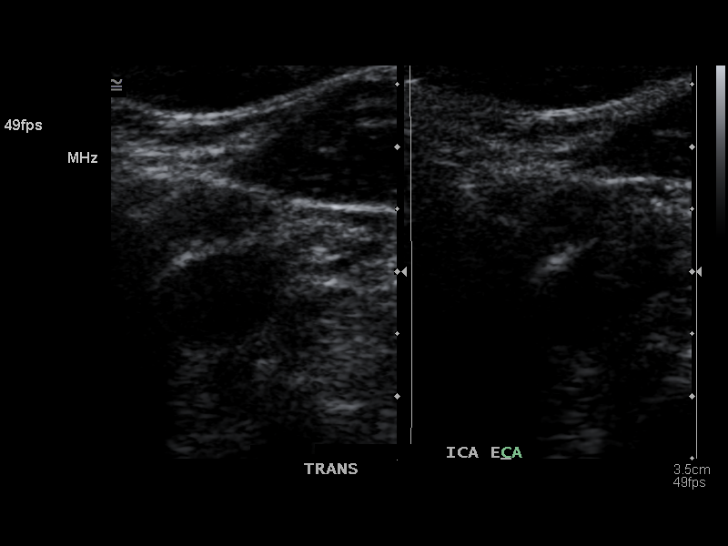
[im 39/65]
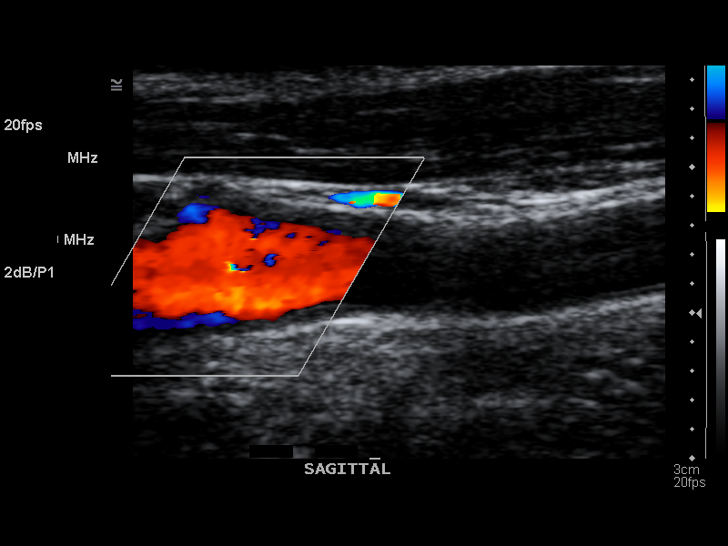
[im 45/65]
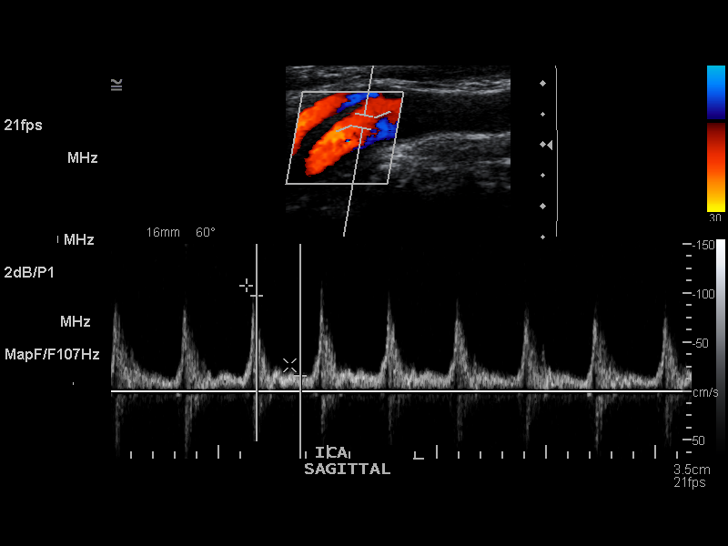
[im 51/65]
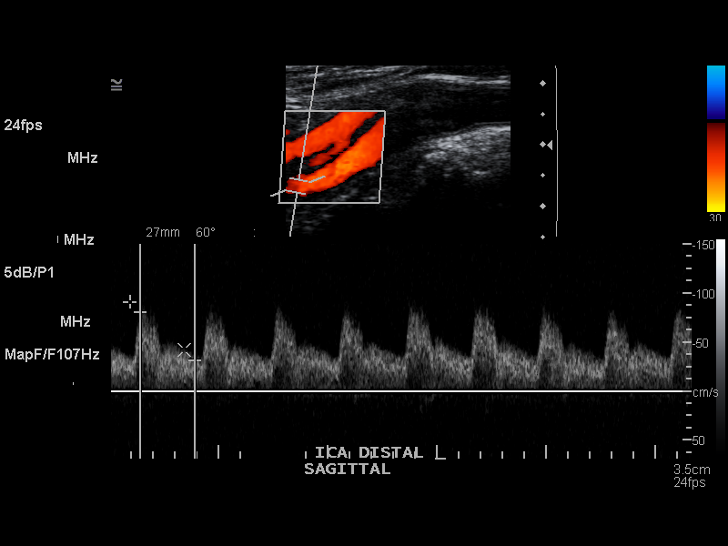
[im 53/65]
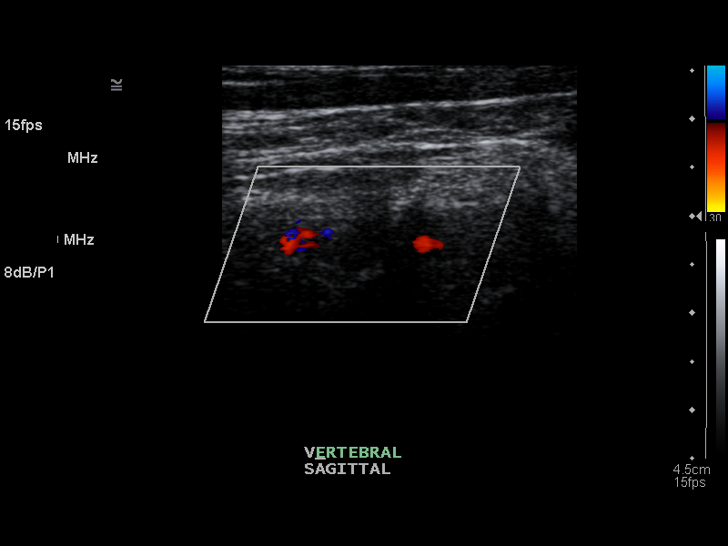
[im 59/65]
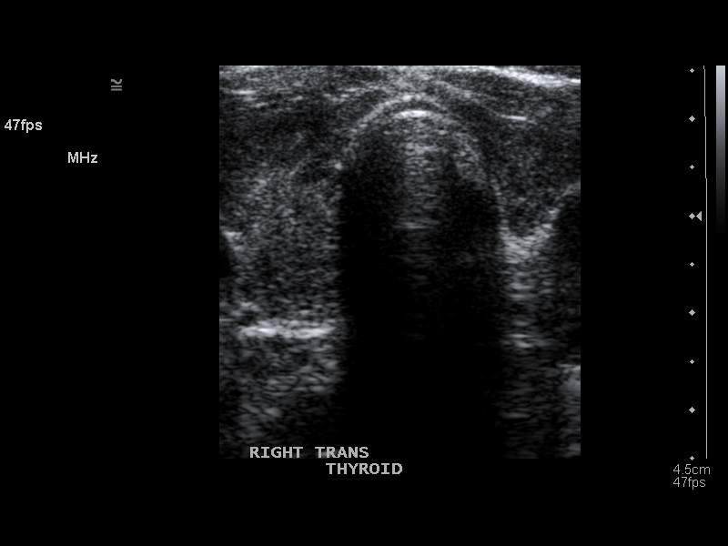
[im 65/65]
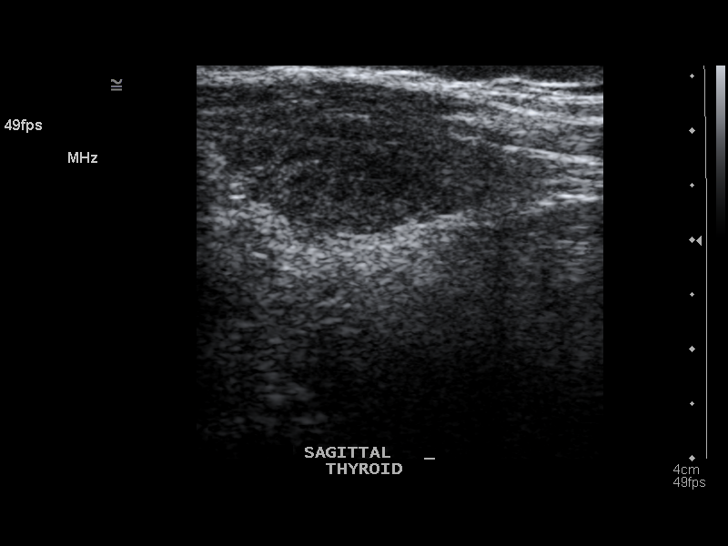

[14 of 24 positions shown; findings below may reference images not displayed]

FINDINGS: Right CCA

101

Left CCA

103

PSV

IC/CC

EDV

Right Internal

113

1.11

42

Left Internal

108

0.96

32

Right Vertebral

Antegrade

Left Vertebral

Antegrade

There is no significant atherosclerotic plaque within the carotid bulbs and internal carotid arteries. No hemodynamically significant stenosis or abnormal elevation of velocity is seen at any level. Both vertebral arteries are patent with appropriate antegrade direction of flow.
IMPRESSION: No hemodynamically significant stenosis within the internal carotid arteries as per NASCET criteria.

## 2019-06-13 IMAGING — MG 3D SCREENING MAMMO BIL W/CAD
5 series · 7 of 24 positions shown · non-contrast
Comparison: 07/19/2019 and 07/13/2018.

------------- REPORT [id number] -------------
[facility]
[address]
We wish to report the following on your recent mammography examination. We are sending a report to your referring physician or other health care provider. 
(       Normal/Negative:
No evidence of cancer.
This statement is mandated by the [place], Department of Health.
Your examination was performed by one of our technologists, who are registered radiological technologists and also specially certified in mammography:
___
Frank, Zarina (M)
Sell, Pepper (M)

Your mammogram was interpreted by our radiologist.
( 
Jean Rousso Aniece, M.D.
(Annual Breast Examination by a physician or other health care provider
(Annual Mammography Screening beginning at age 40
(Monthly Breast Self Examination
------------- REPORT [id number] -------------
SHUM, SIGI
SEOKODI HOMETYGA,DO
EXAM:  3D BILATERAL ANNUAL SCREENING DIGITAL MAMMOGRAM WITH CAD AND TOMOSYNTHESIS
INDICATION: Screening.

[Series 2601: R CC · right · 2 of 2 slices shown]
[im 1/2]
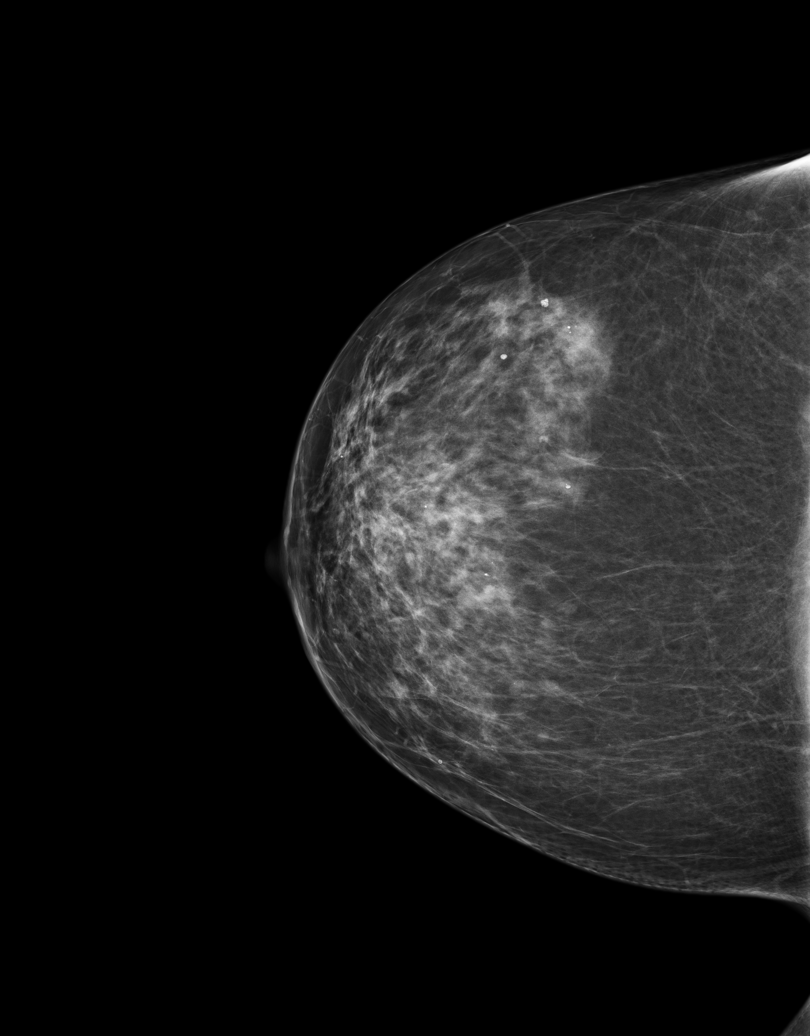
[im 2/2]
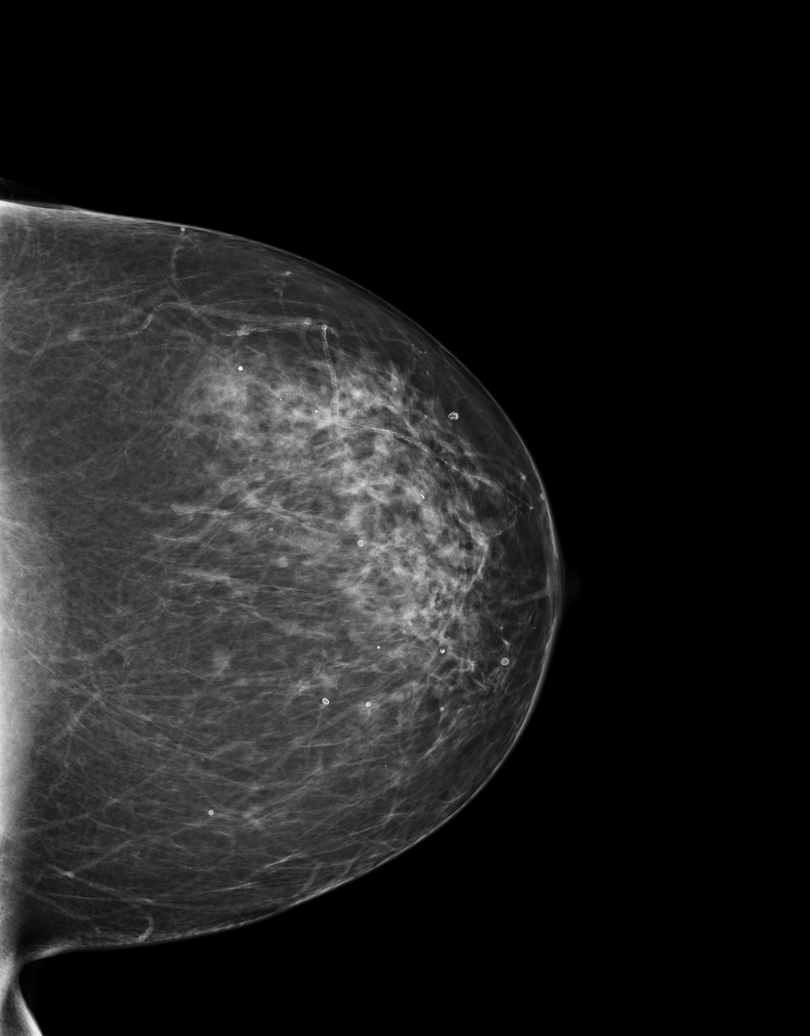

[Series 2603: 3D SCREENING MAMMO BIL W/CAD · 2 acquisitions, 2 frames shown (1 of 2)]
[im 1/2]
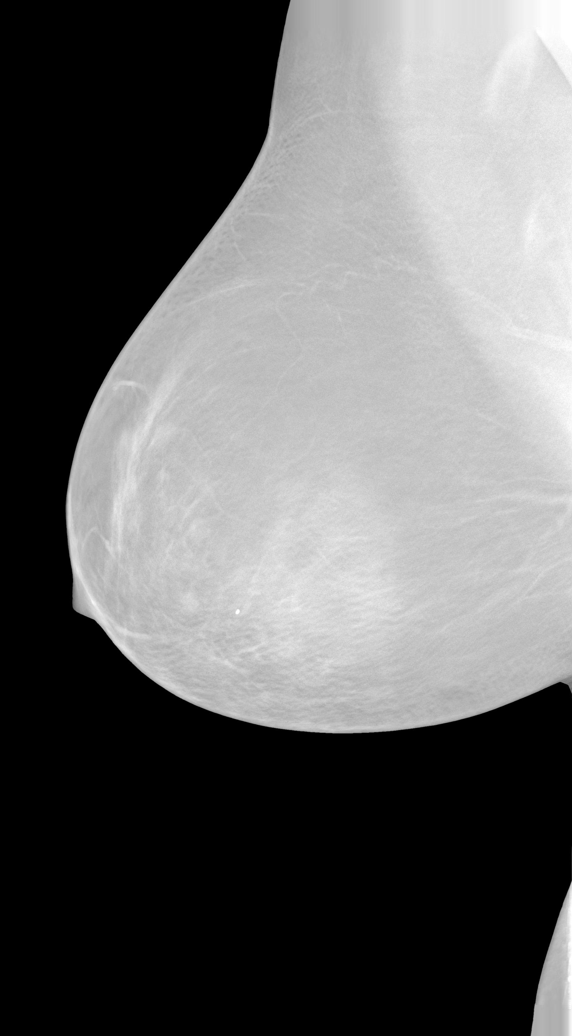
[im 2/2]
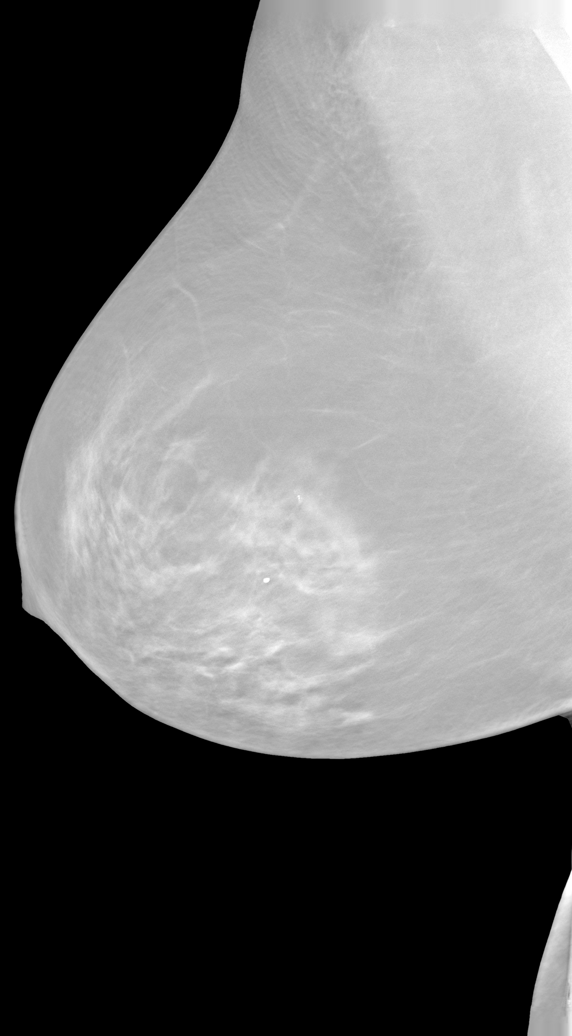

[R]
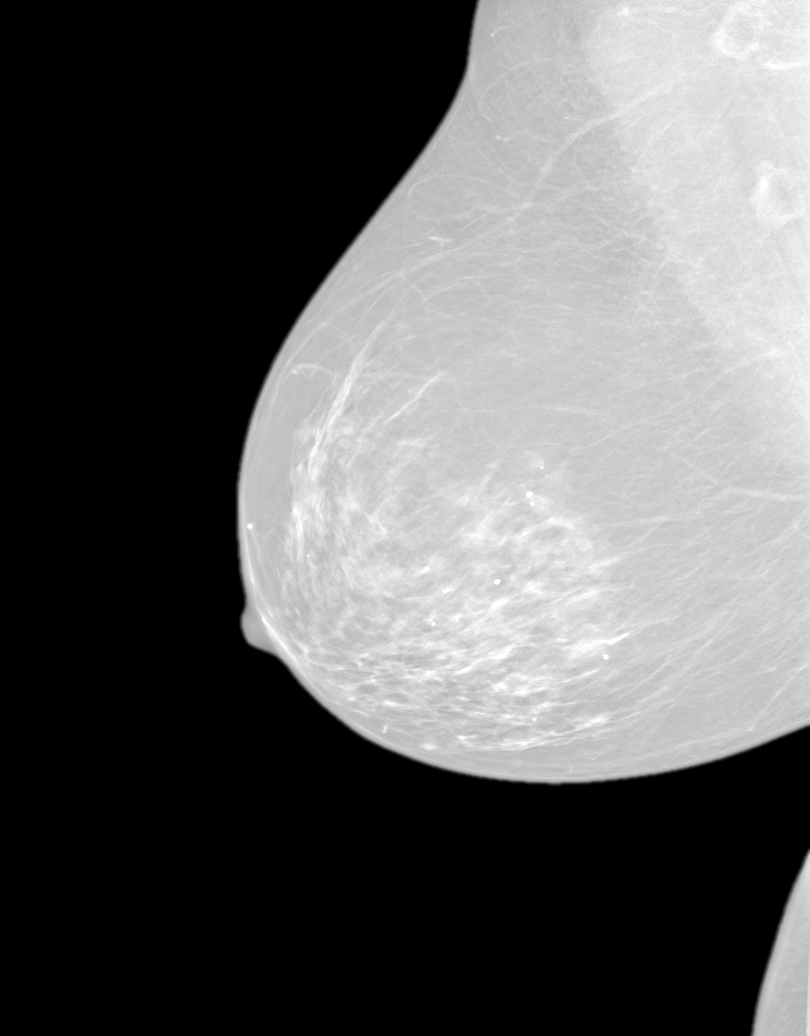

[3D SCREENING MAMMO BIL W/CAD (2 of 2) · tomo slice 15/92.0]
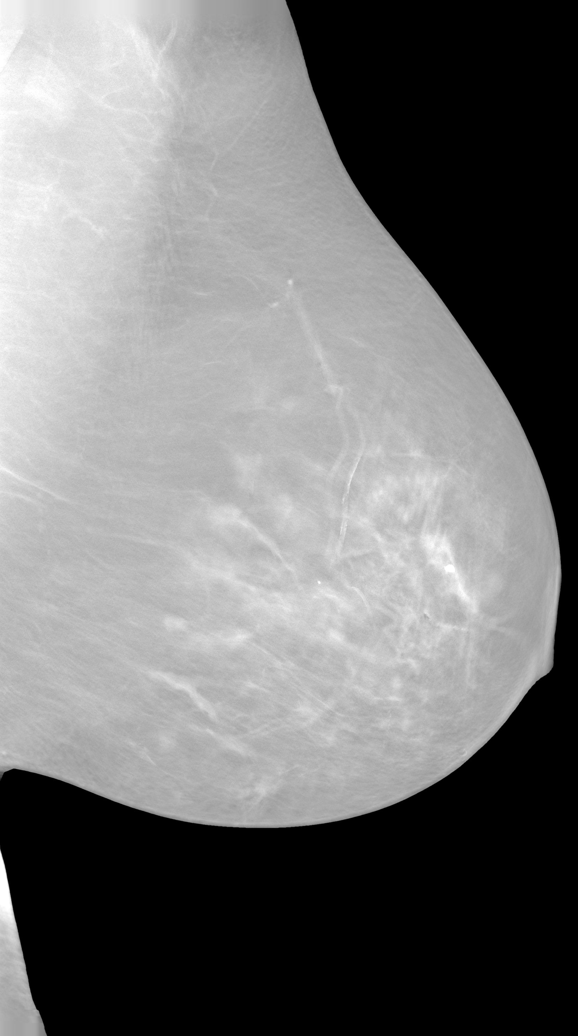

[L]
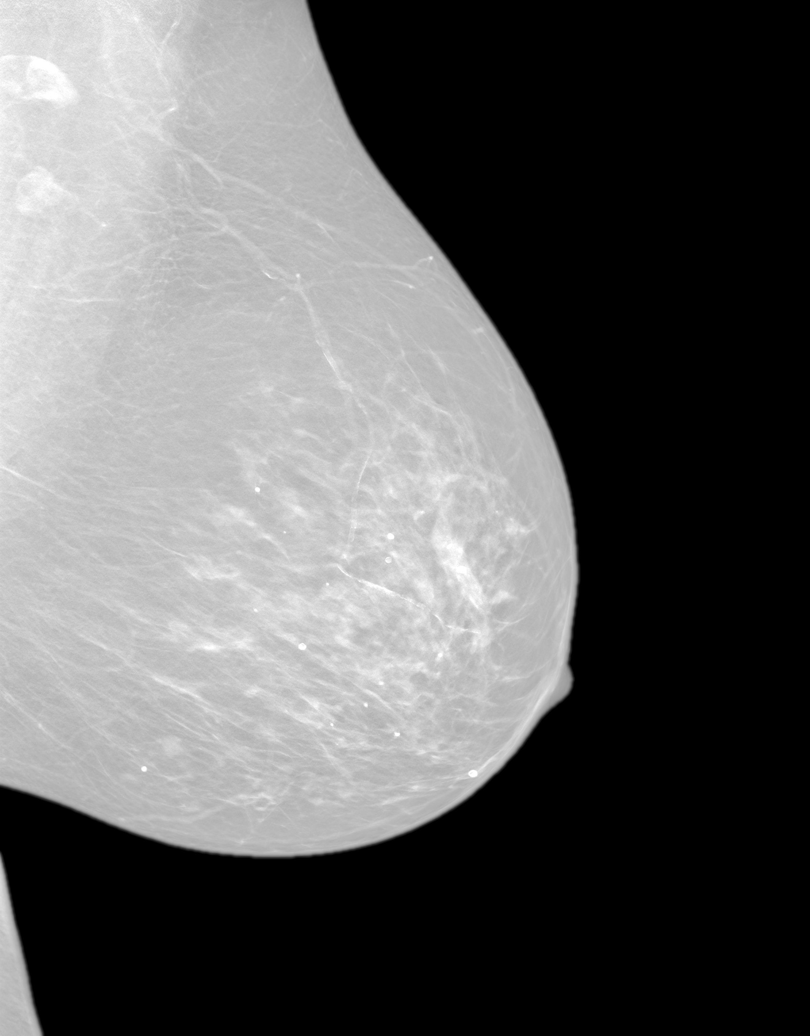

[7 of 24 positions shown; findings below may reference images not displayed]

FINDINGS: Breast parenchyma is heterogeneously dense.  There is no mass or suspicious cluster of microcalcifications.  There is no architectural distortion, skin thickening or nipple retraction.
IMPRESSION: 1.  BIRADS 2-Benign findings. Patient has been added in a reminder system with a target date for the next screening mammography.

2.  DENSITY CODE – C (Heterogeneously dense). 

Final Assessment Code:

Bi-Rads 2 

BI-RADS 0
Need additional imaging evaluation.

BI-RADS 1
Negative mammogram.

BI-RADS 2
Benign finding.

BI-RADS 3
Probably benign finding; short-interval follow-up suggested.

BI-RADS 4
Suspicious abnormality; biopsy should be considered.

BI-RADS 5
Highly suggestive of malignancy; appropriate action should be taken.

BI-RADS 6
Known biopsy-proven malignancy; appropriate action should be taken. 

NOTE:
In compliance with Federal regulations, the results of this mammogram are being sent to the patient.

## 2019-06-27 IMAGING — US US SOFT TISSUES OF HEAD AND NECK
1 series · 14 of 25 positions shown · non-contrast
Comparison: None available.

EXAM:  US SOFT TISSUES OF HEAD AND NECK
INDICATION: Chronic cough, goiter.

[Series 3: us soft tissues of head and neck · 0.08mm/px · 14 of 36 slices shown]
[im 1/36]
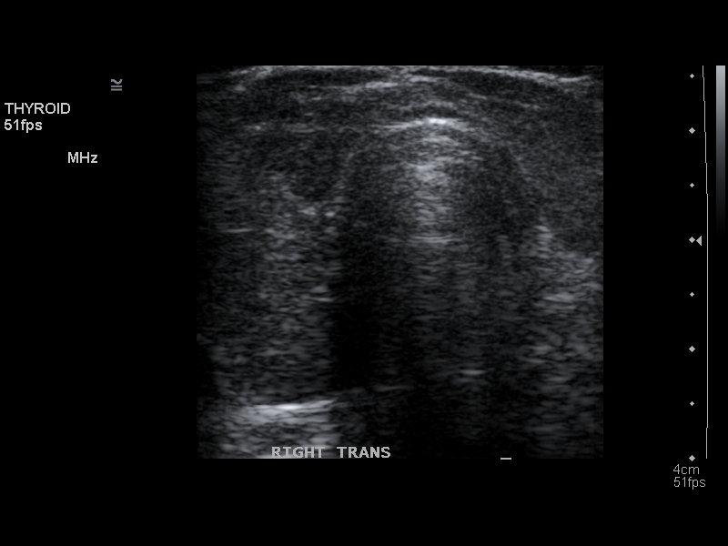
[im 3/36]
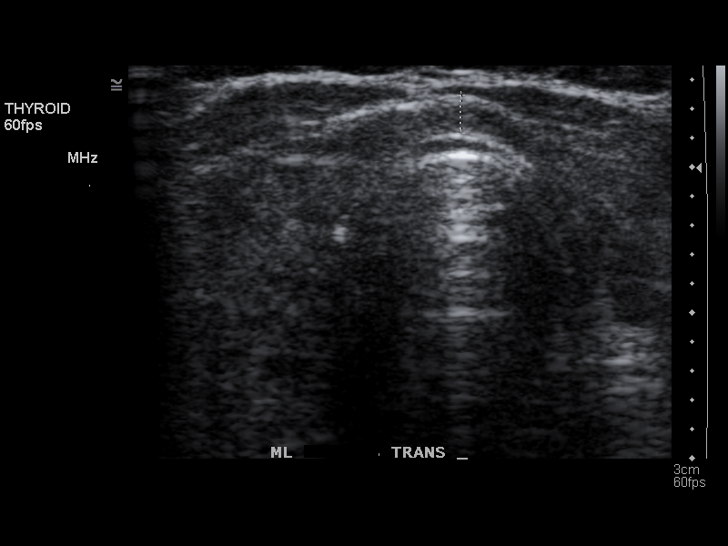
[im 6/36]
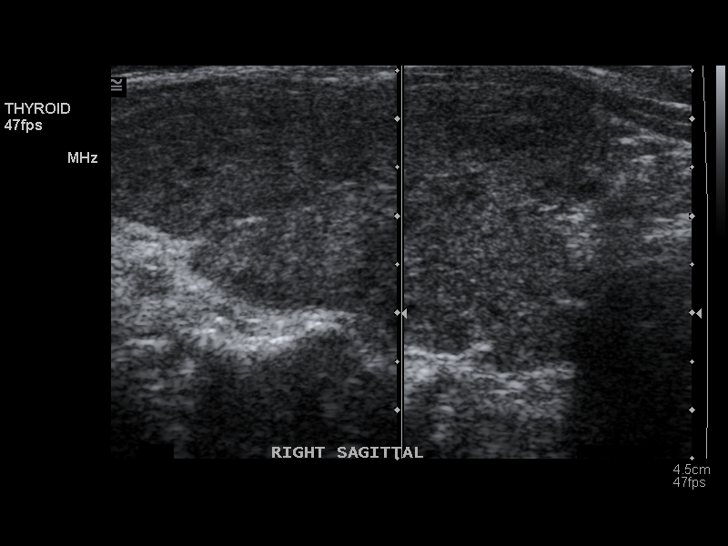
[im 9/36]
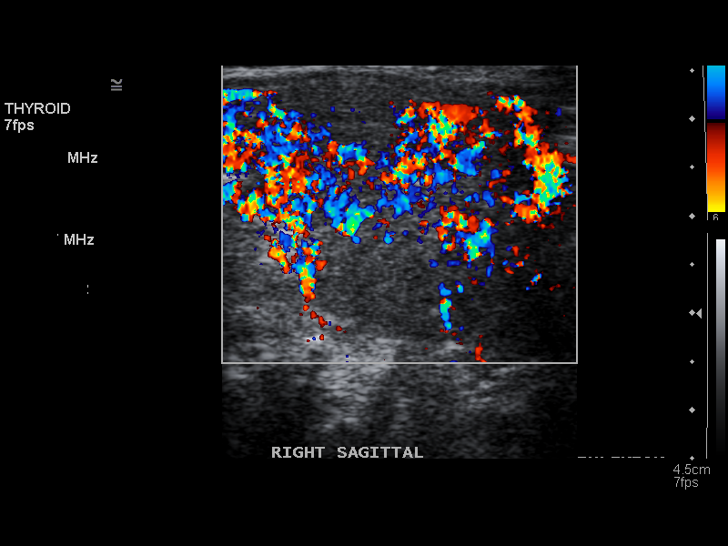
[im 12/36]
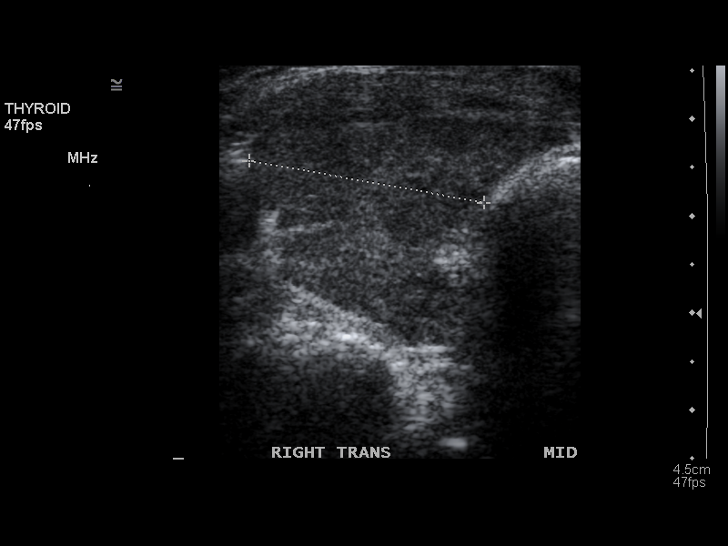
[im 14/36]
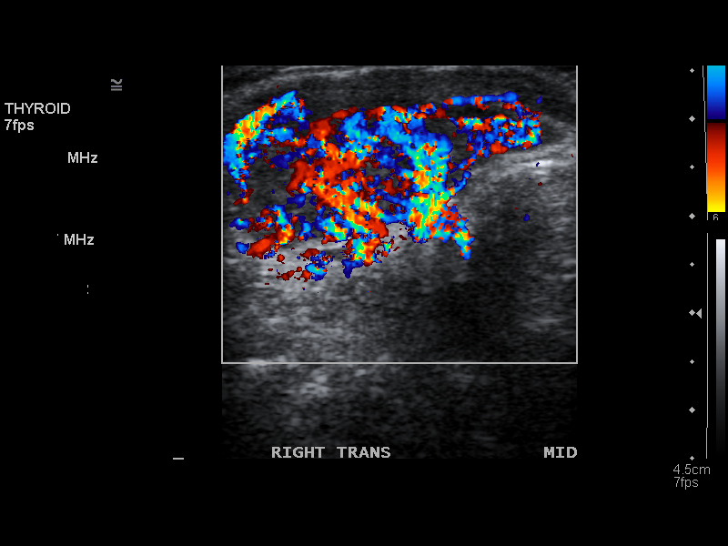
[im 17/36]
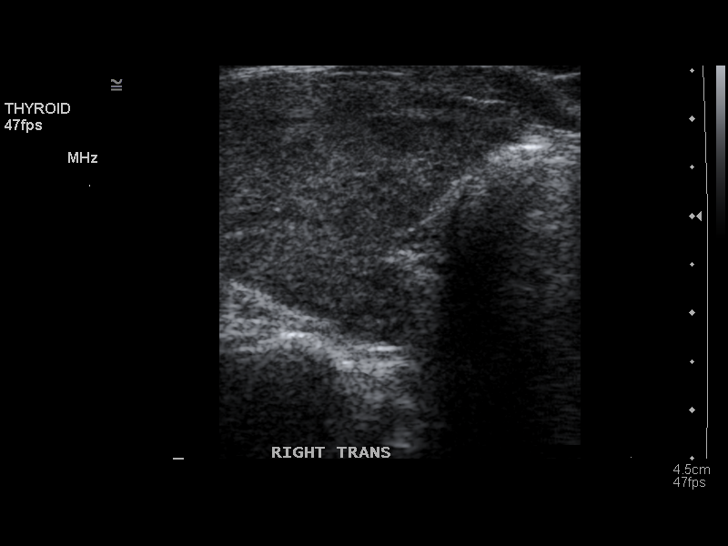
[im 19/36]
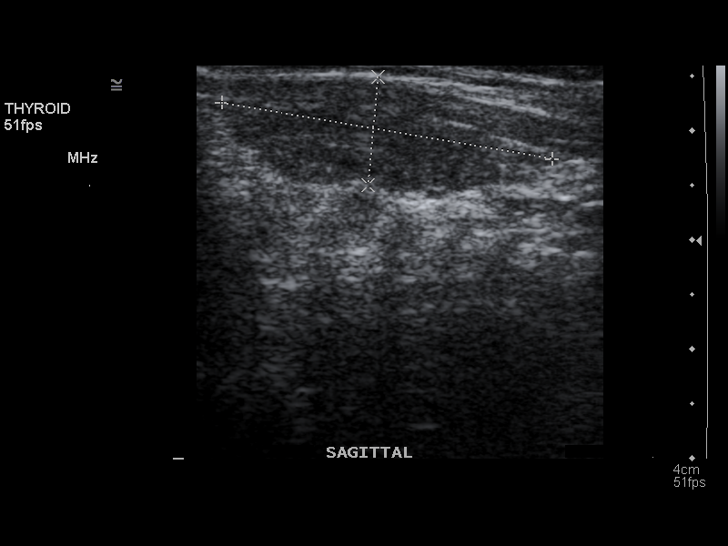
[im 22/36]
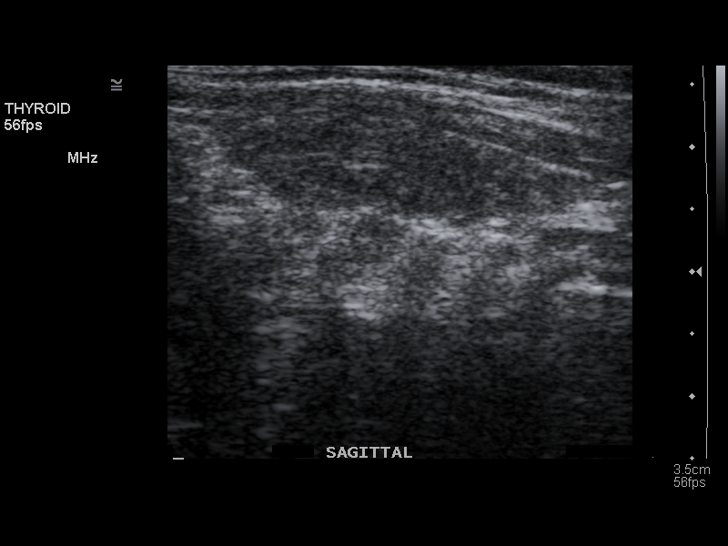
[im 24/36]
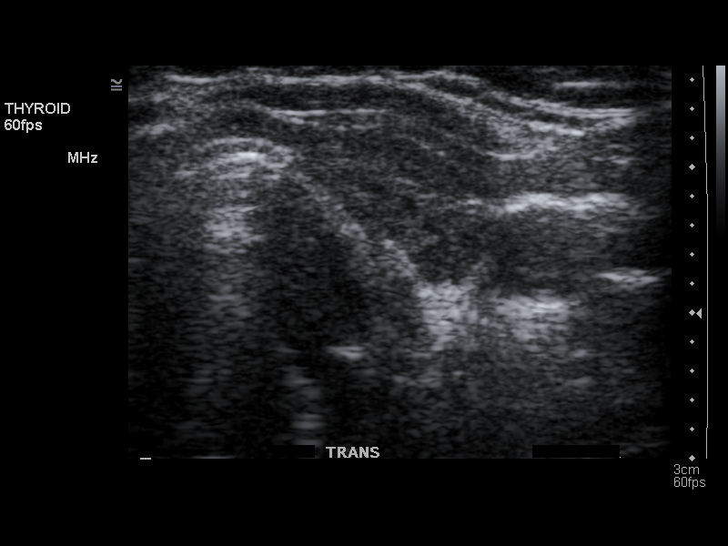
[im 27/36]
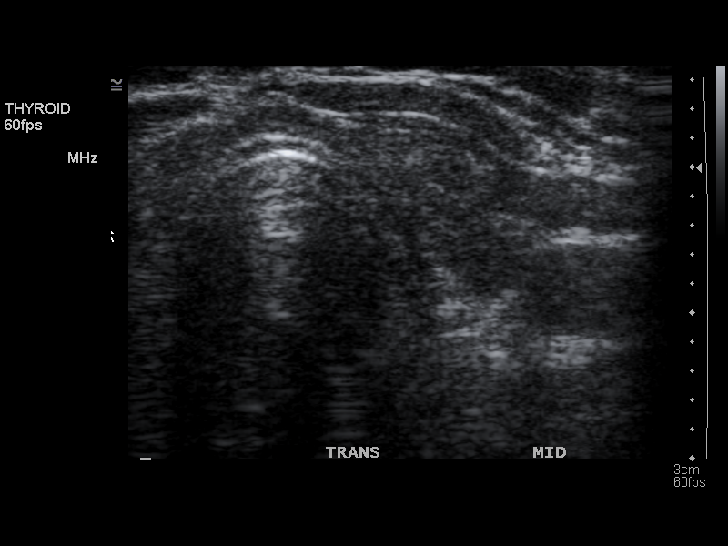
[im 30/36]
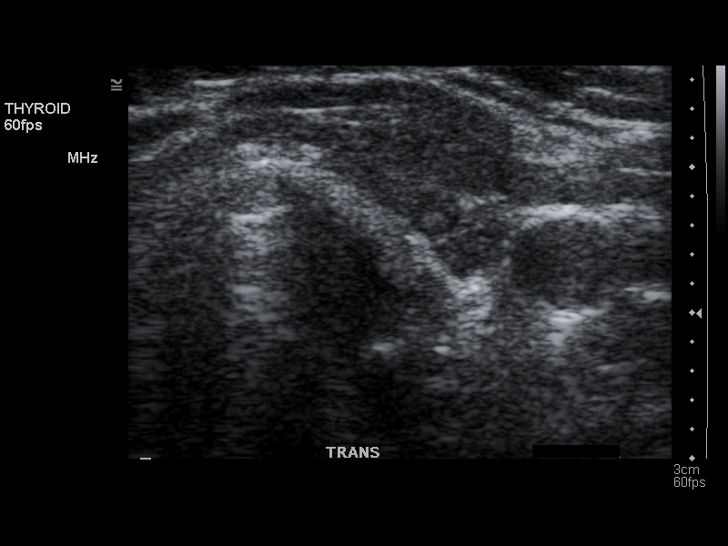
[im 33/36]
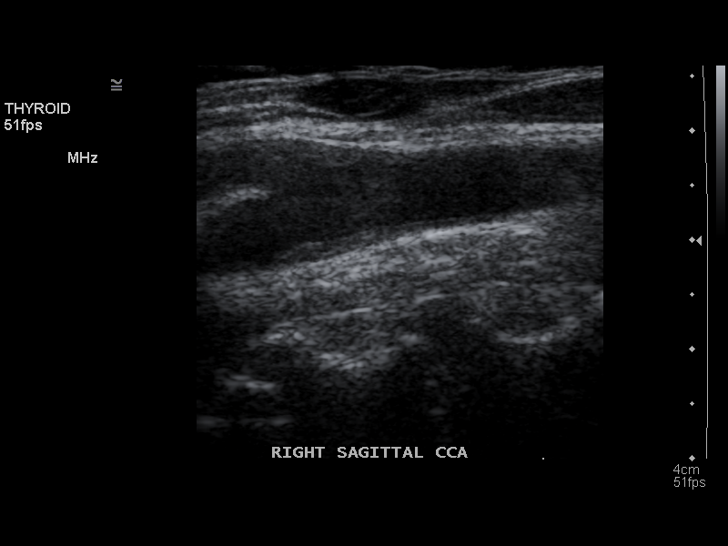
[im 36/36]
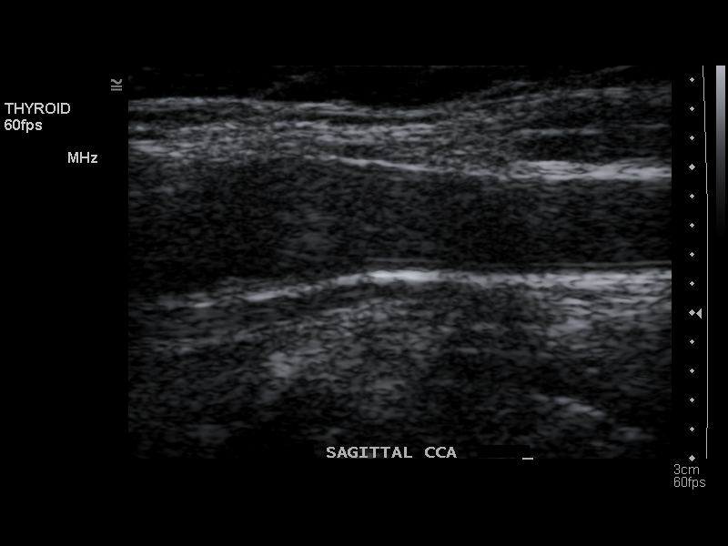

[14 of 25 positions shown; findings below may reference images not displayed]

FINDINGS: Thyroid gland is heterogeneous in echogenicity. Right lobe measures 5.2 x 3.0 x 2.5 cm and left lobe measures 3.1 x 1.0 x 0.7 cm. No discrete solid or cystic nodule is seen on either side.
IMPRESSION: Heterogeneous thyroid gland without discrete nodule on either side.

## 2020-06-13 ENCOUNTER — Ambulatory Visit (HOSPITAL_COMMUNITY)
Admission: RE | Admit: 2020-06-13 | Discharge: 2020-06-13 | Disposition: A | Discharge status: Home / Self Care | Payer: Self-pay | Source: Ambulatory Visit

## 2020-06-13 IMAGING — MG 3D SCREENING MAMMO BIL W/CAD & TOMO
5 series · 7 of 24 positions shown · non-contrast
Comparison: 10/10/2021 and 09/06/2020.

------------- REPORT [id number] -------------
﻿

KENDE, SCHNALZER
FCN TATLISES,DO
EXAM:  3D BILATERAL ANNUAL SCREENING DIGITAL MAMMOGRAM WITH CAD AND TOMOSYNTHESIS
INDICATION: Screening.

[L]
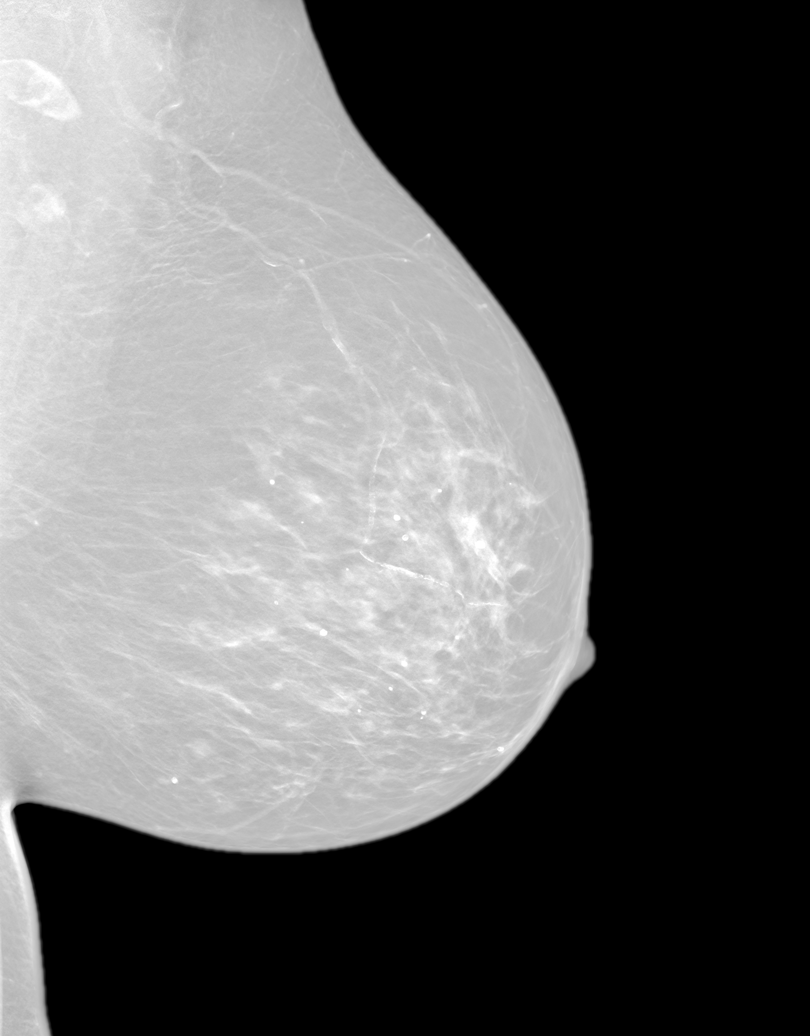

[R]
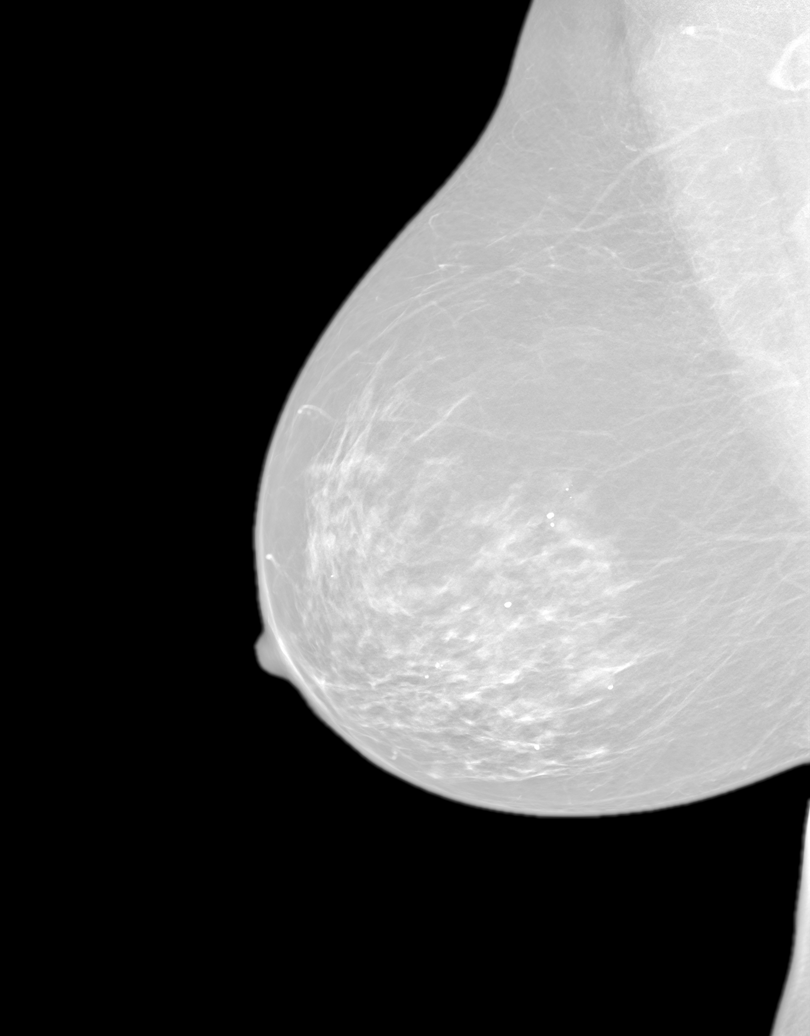

[Series 8385: R CC tomo · right · 0.10mm/px · 2 of 2 slices shown]
[im 1/2]
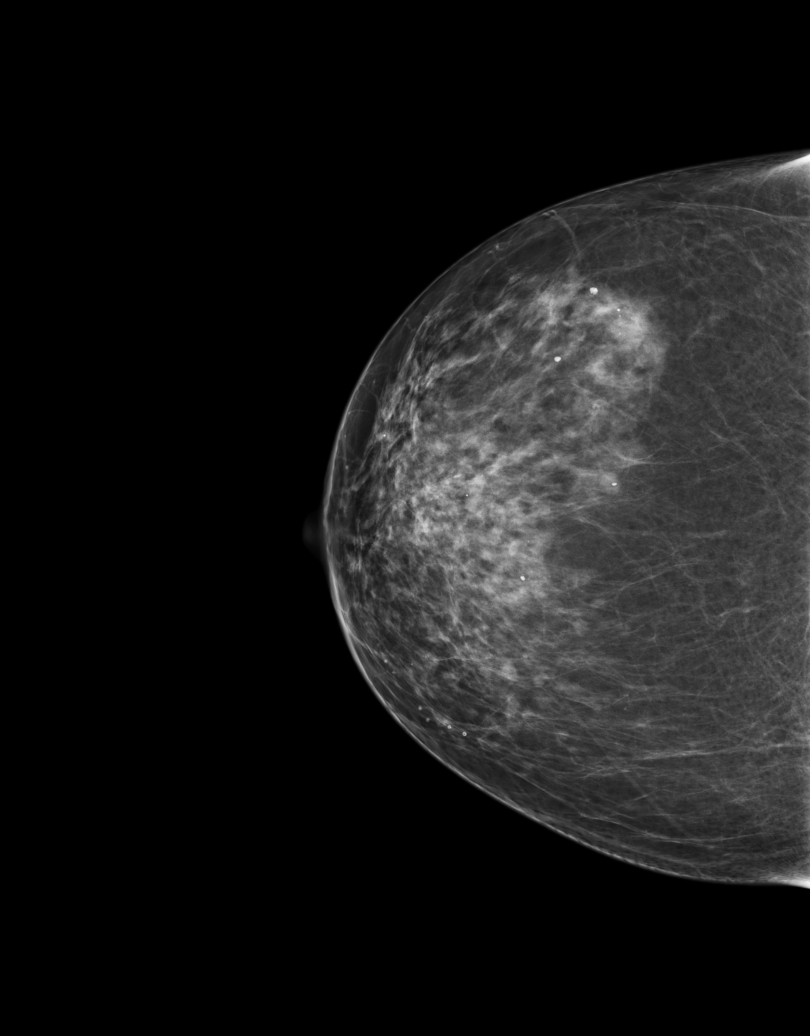
[im 2/2]
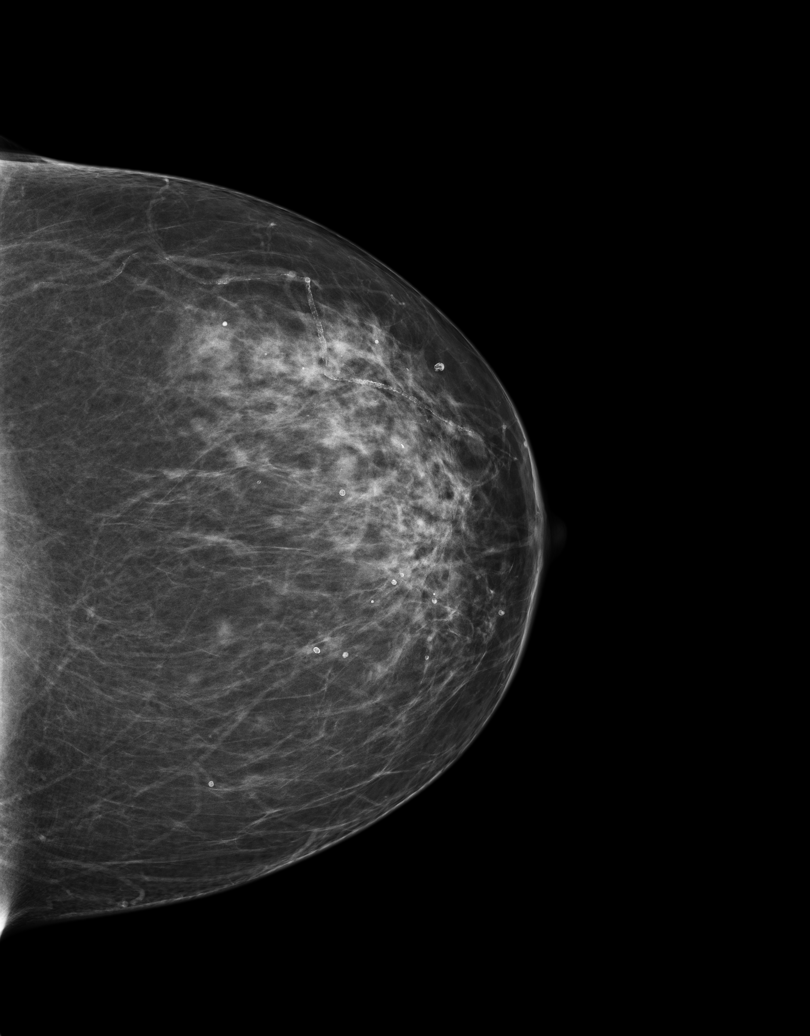

[Series 8387: 3D SCREENING MAMMO BIL W/CAD & TOMO tomo · 2 acquisitions, 2 frames shown (1 of 2)]
[im 1/2]
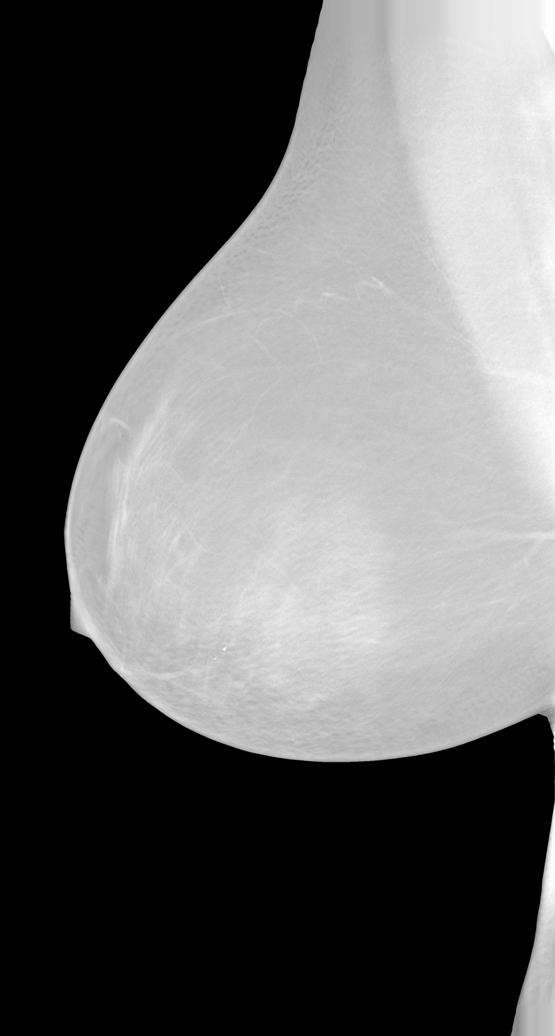
[im 2/2]
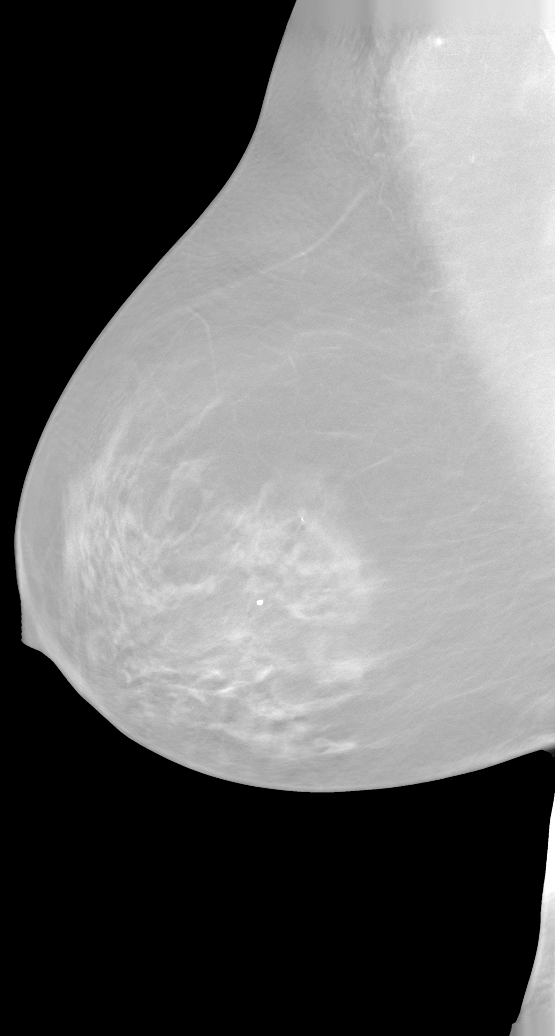

[3D SCREENING MAMMO BIL W/CAD & TOMO tomo (2 of 2) · tomo slice 14/88.0]
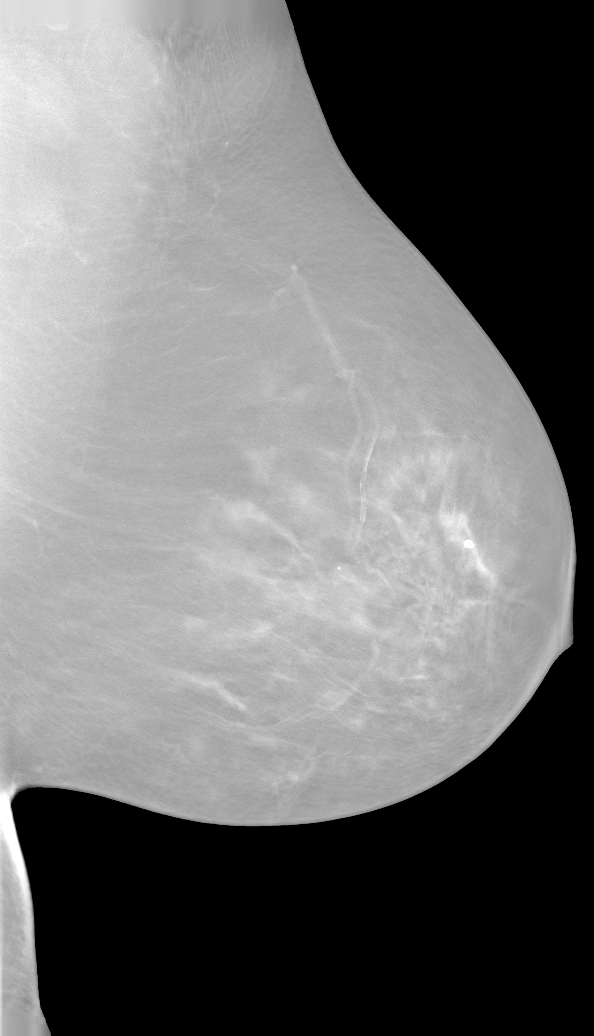

[7 of 24 positions shown; findings below may reference images not displayed]

FINDINGS: Breast parenchyma is heterogeneously dense.  There is no mass or suspicious cluster of microcalcifications.  There is no architectural distortion, skin thickening or nipple retraction.
IMPRESSION: 1.  BIRADS 2-Benign findings. Patient has been added in a reminder system with a target date for the next screening mammography.

2.  DENSITY CODE – C (Heterogeneously dense). 

Final Assessment Code:

Bi-Rads 2 

BI-RADS 0
 Need additional imaging evaluation.

BI-RADS 1
 Negative mammogram.

BI-RADS 2
 Benign finding.

BI-RADS 3
 Probably benign finding; short-interval follow-up suggested.

BI-RADS 4
 Suspicious abnormality; biopsy should be considered.

BI-RADS 5
 Highly suggestive of malignancy; appropriate action should be taken.

BI-RADS 6
 Known biopsy-proven malignancy; appropriate action should be taken.

NOTE:
In compliance with Federal regulations, the results of this mammogram are being sent to the patient.

------------- REPORT [id number] -------------
[facility]
[address]
We wish to report the following on your recent mammography examination. We are sending a report to your referring physician or other health care provider. 
(       Normal/Negative:
No evidence of cancer.
This statement is mandated by the [place], Department of Health.
Your examination was performed by one of our technologists, who are registered radiological technologists and also specially certified in mammography:
___
Silomea, Viliame R (M)

Your mammogram was interpreted by our radiologist.

( 
Kyuhong Jeang, M.D.

(Annual Breast Examination by a physician or other health care provider
(Annual Mammography Screening beginning at age 40
(Monthly Breast Self Examination

## 2021-06-15 ENCOUNTER — Other Ambulatory Visit: Payer: Self-pay

## 2021-06-15 ENCOUNTER — Other Ambulatory Visit (HOSPITAL_COMMUNITY): Payer: Self-pay | Admitting: FAMILY PRACTICE

## 2021-06-15 ENCOUNTER — Other Ambulatory Visit (INDEPENDENT_AMBULATORY_CARE_PROVIDER_SITE_OTHER): Payer: Medicare Other

## 2021-06-15 ENCOUNTER — Inpatient Hospital Stay
Admission: RE | Admit: 2021-06-15 | Discharge: 2021-06-15 | Disposition: A | Discharge status: Home / Self Care | Payer: Medicare Other | Source: Ambulatory Visit | Attending: FAMILY PRACTICE | Admitting: FAMILY PRACTICE

## 2021-06-15 DIAGNOSIS — N390 Urinary tract infection, site not specified: Secondary | ICD-10-CM

## 2021-06-15 DIAGNOSIS — E119 Type 2 diabetes mellitus without complications: Secondary | ICD-10-CM | POA: Insufficient documentation

## 2021-06-15 DIAGNOSIS — M533 Sacrococcygeal disorders, not elsewhere classified: Secondary | ICD-10-CM

## 2021-06-15 LAB — MICROALBUMIN URINE, RANDOM: MICROALBUMIN RANDOM URINE: 0.6 mg/dL

## 2021-06-16 LAB — URINE CULTURE: URINE CULTURE: NO GROWTH

## 2021-06-17 IMAGING — MG 3D SCREENING MAMMO BIL AND TOMO
5 series · 7 of 24 positions shown · non-contrast
Comparison: 06/30/2022 and 06/29/2021.

------------- REPORT [id number] -------------
﻿

ICE, MOTJATJI
DIONYSSIA AUJLA,DO
EXAM:  3D BILATERAL ANNUAL SCREENING DIGITAL MAMMOGRAM WITH TOMOSYNTHESIS
INDICATION: Screening.

[R]
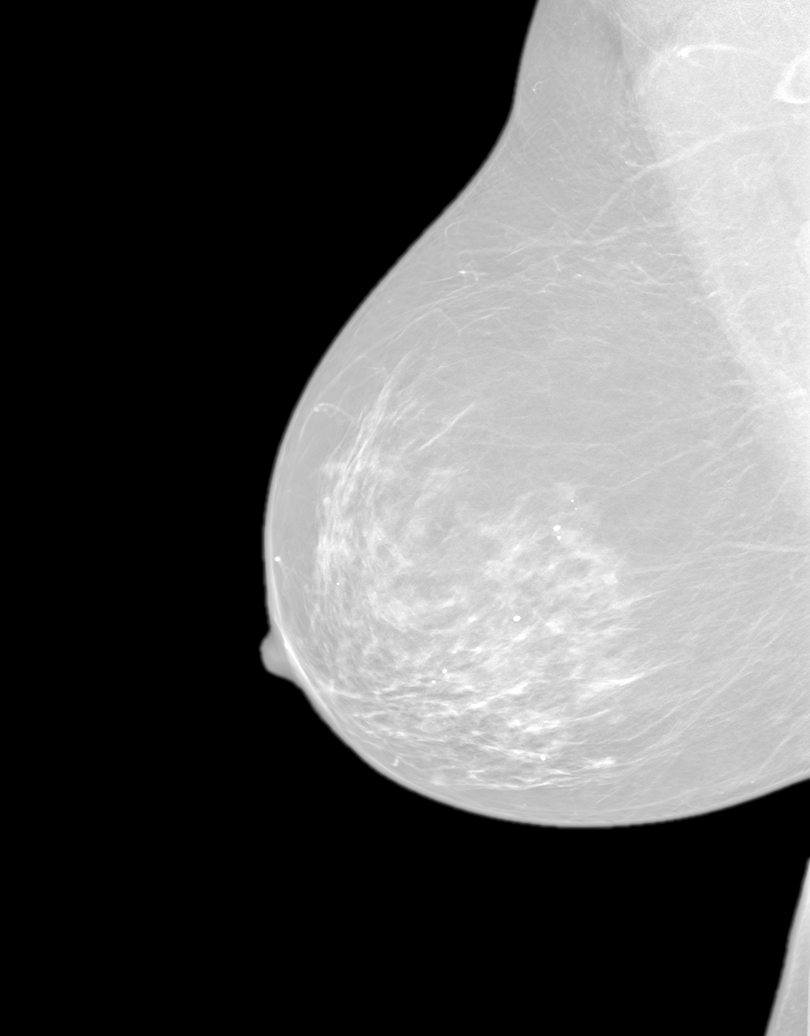

[L]
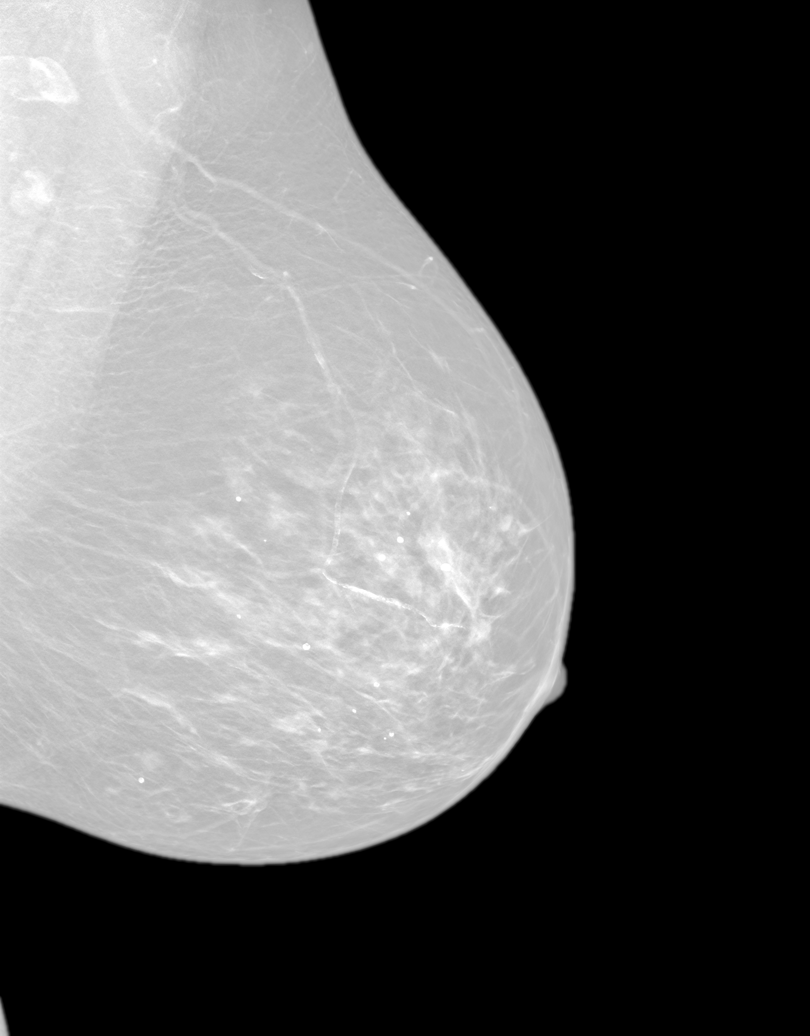

[R CC tomo · right · 0.10mm/px · 2 of 2 slices shown]
[im 1/2]
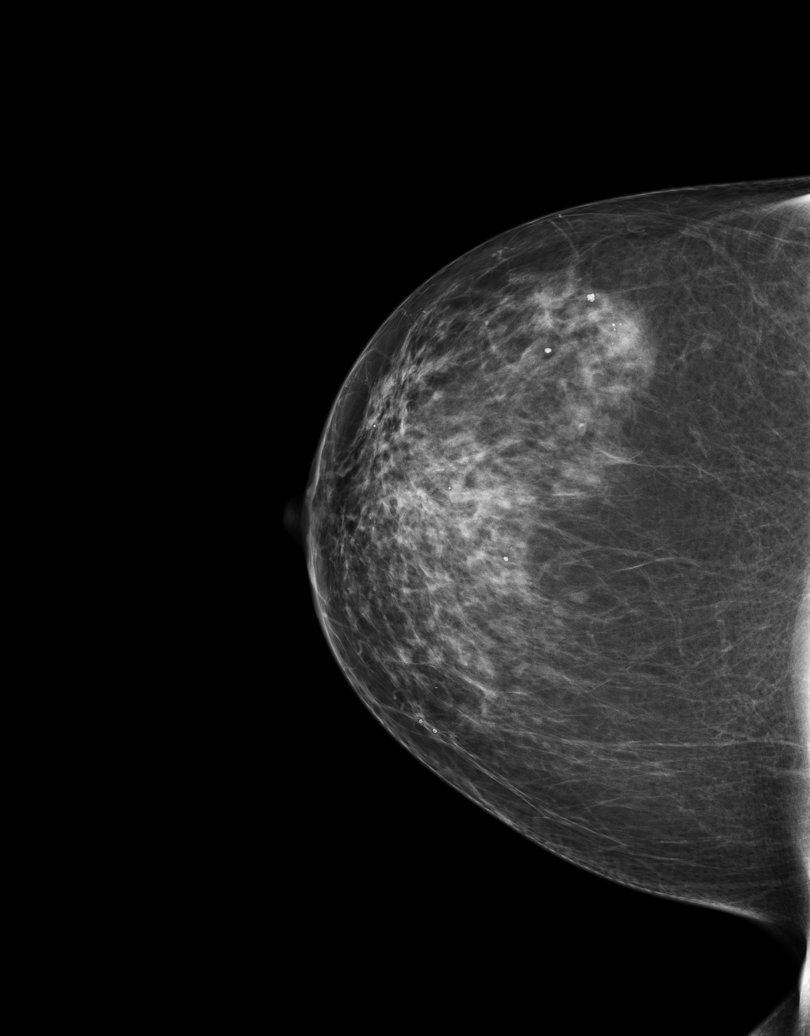
[im 2/2]
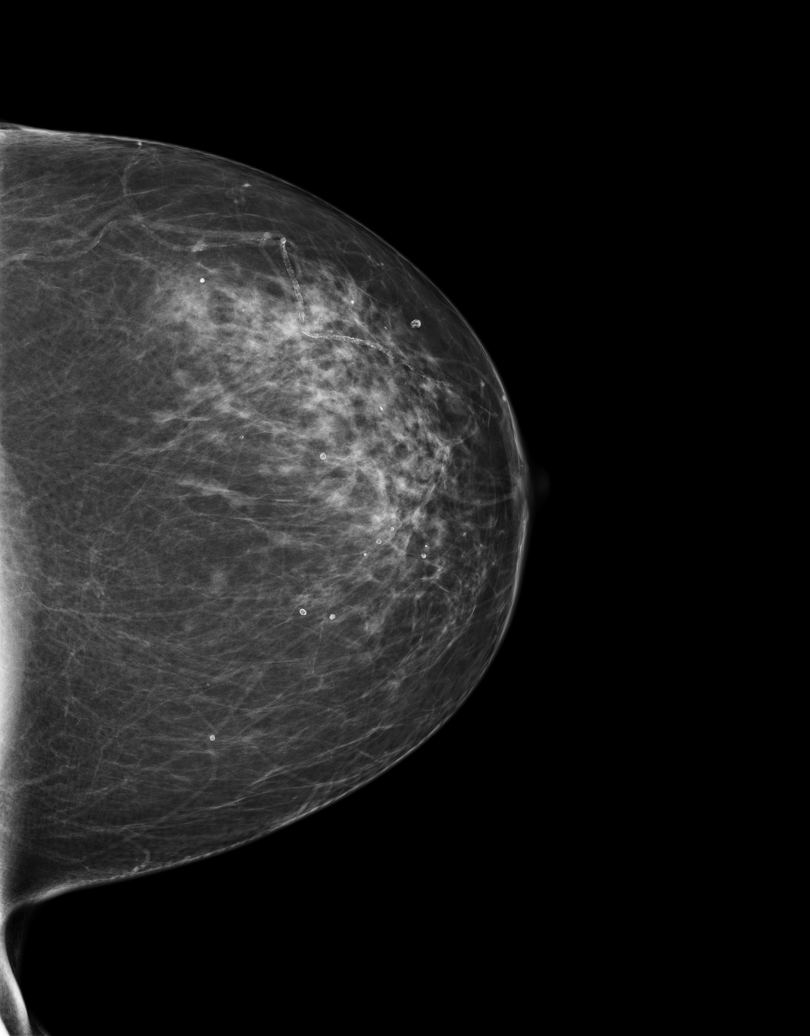

[3D SCREENING MAMMO BIL AND TOMO tomo · 2 acquisitions, 2 frames shown (1 of 2)]
[im 1/2]
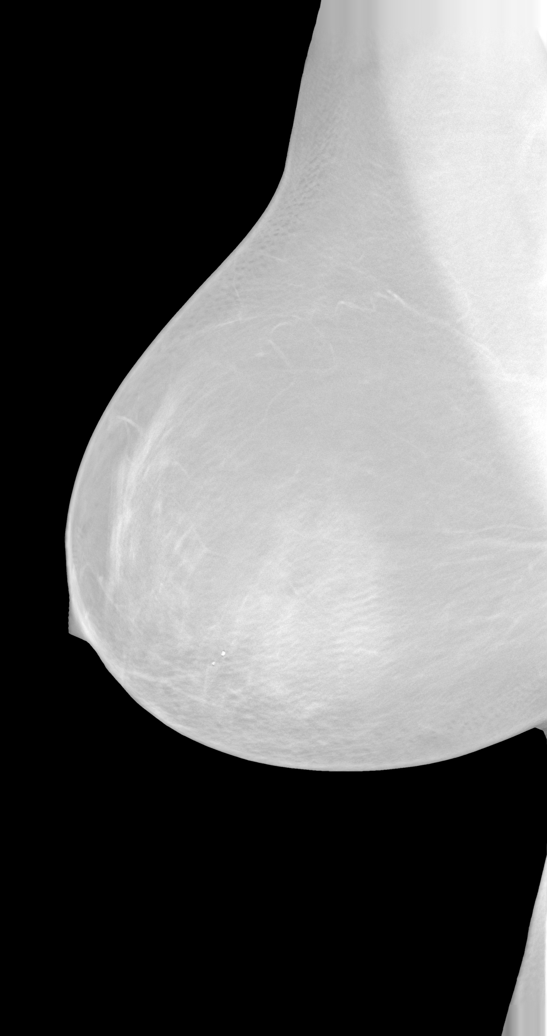
[im 2/2]
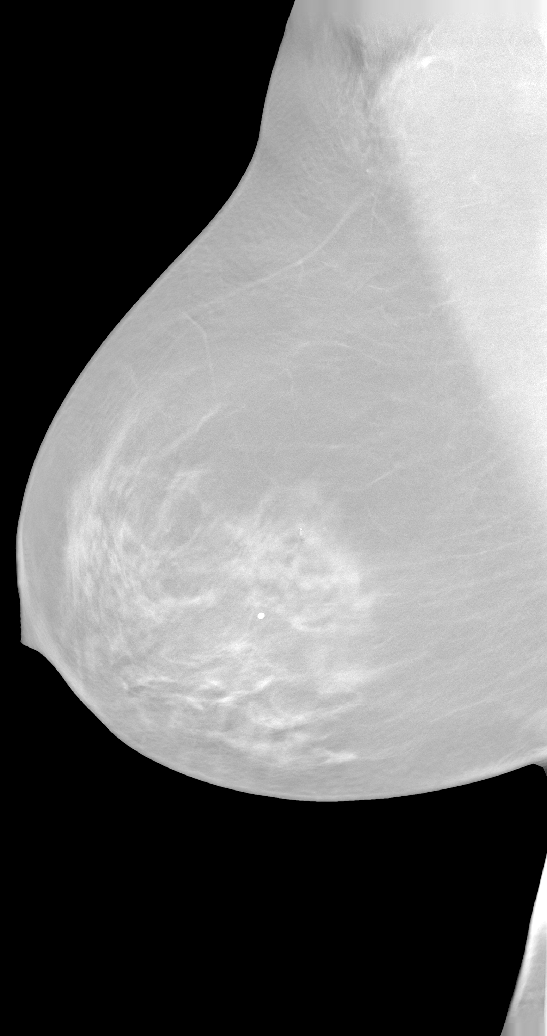

[3D SCREENING MAMMO BIL AND TOMO tomo (2 of 2) · tomo slice 14/87.0]
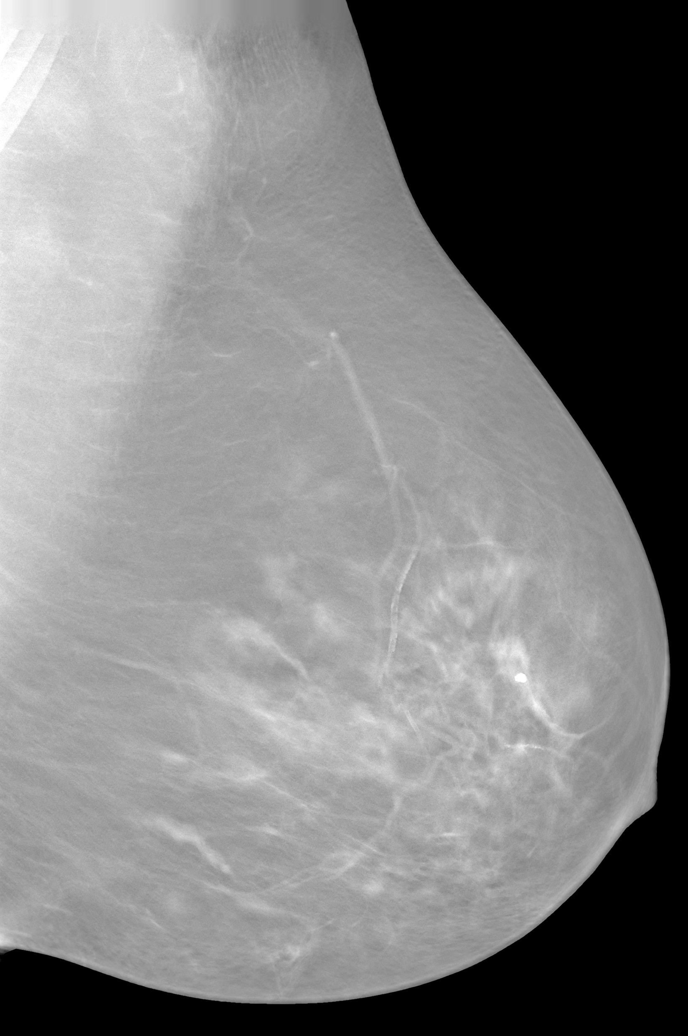

[7 of 24 positions shown; findings below may reference images not displayed]

FINDINGS: Breast parenchyma is heterogeneously dense.  There is no mass or suspicious cluster of microcalcifications.  There is no architectural distortion, skin thickening or nipple retraction.
IMPRESSION: 1.  BIRADS 2-Benign findings. Patient has been added in a reminder system with a target date for the next screening mammography.

2.  DENSITY CODE – C (Heterogeneously dense). 

Final Assessment Code:

Bi-Rads 2 

BI-RADS 0
 Need additional imaging evaluation.

BI-RADS 1
 Negative mammogram.

BI-RADS 2
 Benign finding.

BI-RADS 3
 Probably benign finding; short-interval follow-up suggested.

BI-RADS 4
 Suspicious abnormality; biopsy should be considered.

BI-RADS 5
 Highly suggestive of malignancy; appropriate action should be taken.

BI-RADS 6
 Known biopsy-proven malignancy; appropriate action should be taken.

NOTE:
In compliance with Federal regulations, the results of this mammogram are being sent to the patient.

------------- REPORT [id number] -------------
[facility] [place]
[address]
We wish to report the following on your recent mammography examination. We are sending a report to your referring physician or other health care provider. 
(       Normal/Negative:
No evidence of cancer.
This statement is mandated by the [place], Department of Health.
Your examination was performed by one of our technologists, who are registered radiological technologists and also specially certified in mammography:
___
Silomea, Viliame R (M)

Your mammogram was interpreted by our radiologist.

( 
Kyuhong Jeang, M.D.

(Annual Breast Examination by a physician or other health care provider
(Annual Mammography Screening beginning at age 40
(Monthly Breast Self Examination

## 2021-07-07 ENCOUNTER — Other Ambulatory Visit (HOSPITAL_COMMUNITY): Payer: Self-pay | Admitting: FAMILY PRACTICE

## 2021-07-07 DIAGNOSIS — N63 Unspecified lump in unspecified breast: Secondary | ICD-10-CM

## 2021-07-09 ENCOUNTER — Inpatient Hospital Stay
Admission: RE | Admit: 2021-07-09 | Discharge: 2021-07-09 | Disposition: A | Discharge status: Home / Self Care | Payer: Medicare Other | Source: Ambulatory Visit | Attending: FAMILY PRACTICE | Admitting: FAMILY PRACTICE

## 2021-07-09 ENCOUNTER — Other Ambulatory Visit: Payer: Self-pay

## 2021-07-09 ENCOUNTER — Encounter (HOSPITAL_COMMUNITY): Payer: Self-pay

## 2021-07-09 ENCOUNTER — Inpatient Hospital Stay (HOSPITAL_BASED_OUTPATIENT_CLINIC_OR_DEPARTMENT_OTHER)
Admission: RE | Admit: 2021-07-09 | Discharge: 2021-07-09 | Disposition: A | Discharge status: Home / Self Care | Payer: Medicare Other | Source: Ambulatory Visit

## 2021-07-09 DIAGNOSIS — Z803 Family history of malignant neoplasm of breast: Secondary | ICD-10-CM | POA: Insufficient documentation

## 2021-07-09 DIAGNOSIS — N63 Unspecified lump in unspecified breast: Secondary | ICD-10-CM | POA: Insufficient documentation

## 2021-07-09 DIAGNOSIS — N644 Mastodynia: Secondary | ICD-10-CM | POA: Insufficient documentation

## 2021-07-14 IMAGING — MR MRI LUMBAR SPINE WITHOUT CONTRAST
5 of 6 series · 32 of 48 positions shown · IV contrast (gadolinium)
Comparison: None available.

﻿EXAM:  [id number]   MRI LUMBAR SPINE WITHOUT CONTRAST
INDICATION: Lower back pain.
TECHNIQUE: Multiplanar multisequential MRI of the lumbar spine was performed without gadolinium contrast.

[Series 5: T2 · sagittal · 4.0mm · 0.83mm/px · 6 of 13 slices shown (1 of 3)]
[im 1/13]
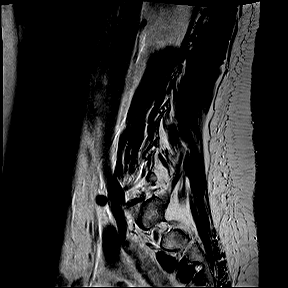
[im 3/13]
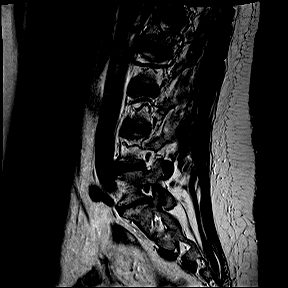
[im 5/13]
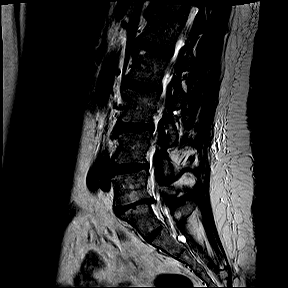
[im 8/13]
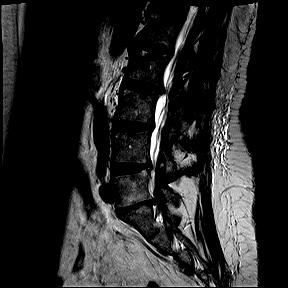
[im 10/13]
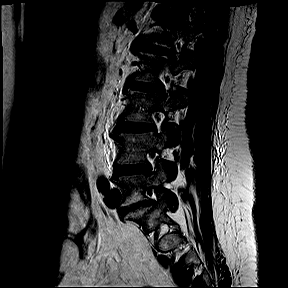
[im 13/13]
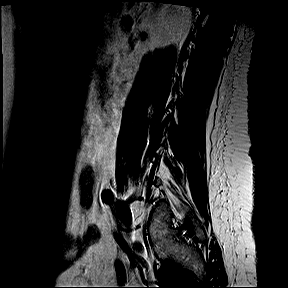

[Series 6: T1 · sagittal · 4.0mm · 0.83mm/px · 6 of 13 slices shown (1 of 2)]
[im 1/13]
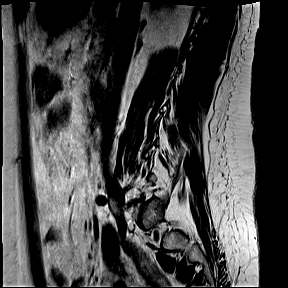
[im 3/13]
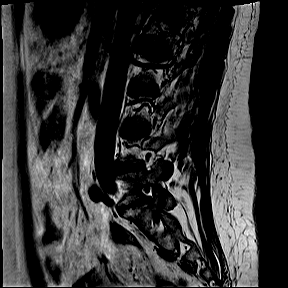
[im 5/13]
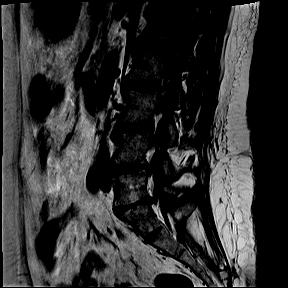
[im 8/13]
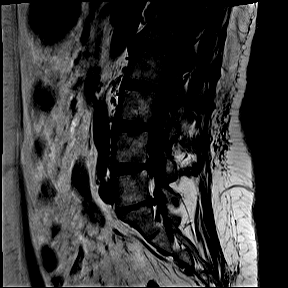
[im 10/13]
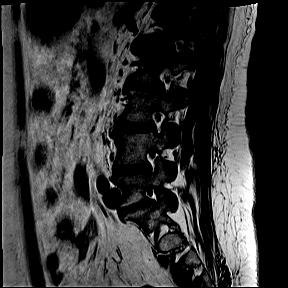
[im 13/13]
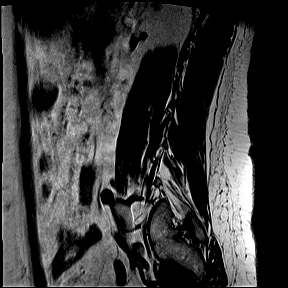

[Series 8: T2 · coronal · 5.0mm · 0.82mm/px · 8 of 18 slices shown (2 of 3)]
[im 1/18]
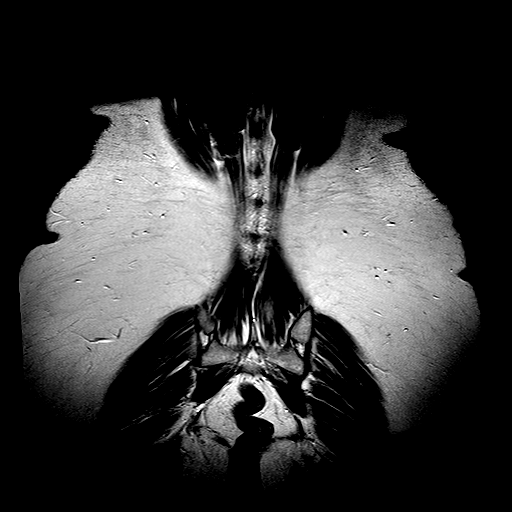
[im 3/18]
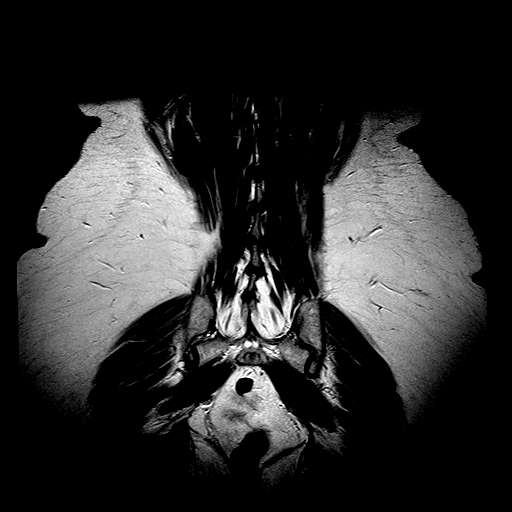
[im 5/18]
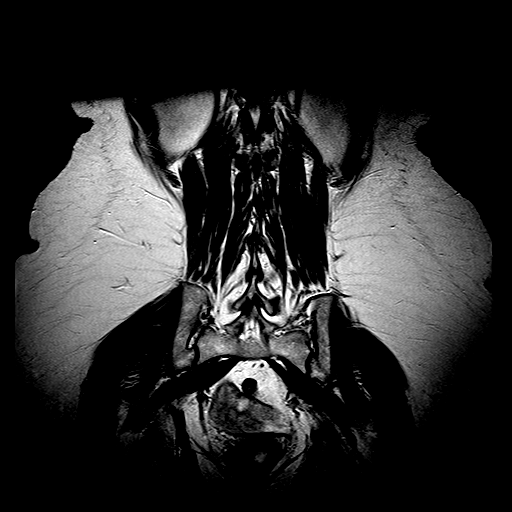
[im 8/18]
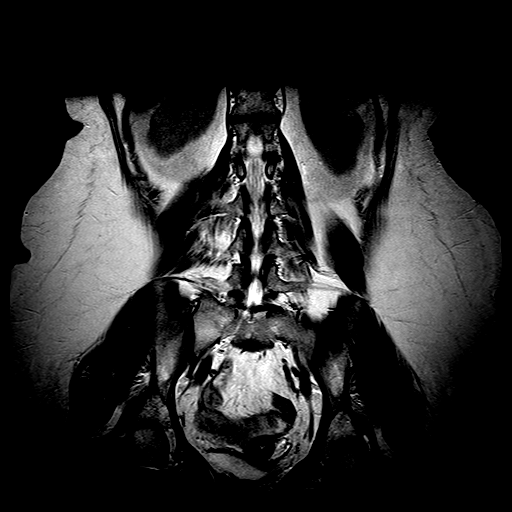
[im 10/18]
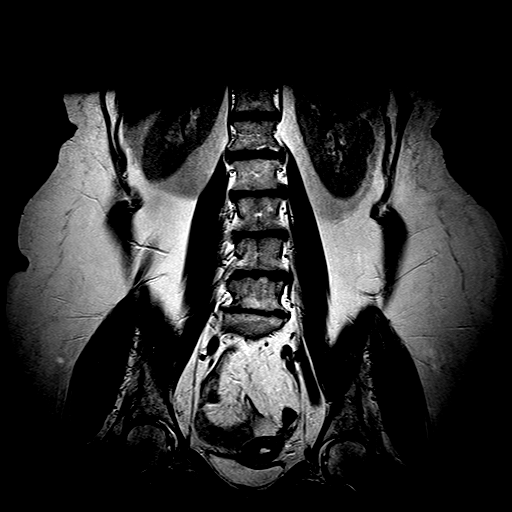
[im 13/18]
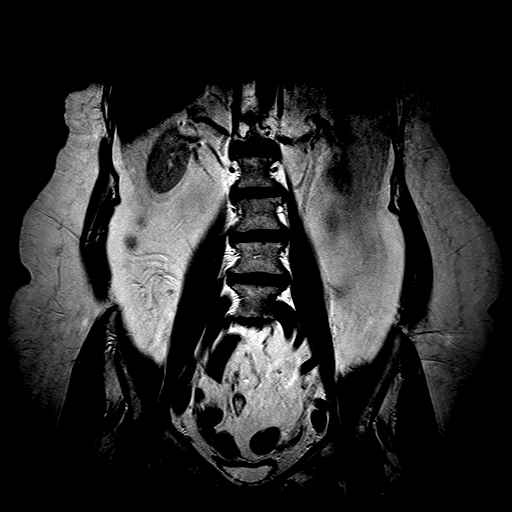
[im 15/18]
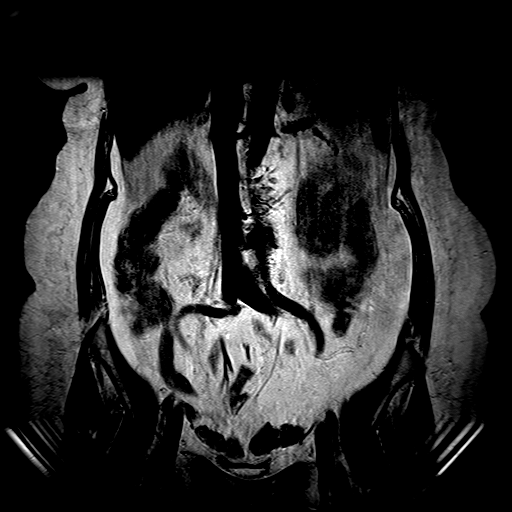
[im 18/18]
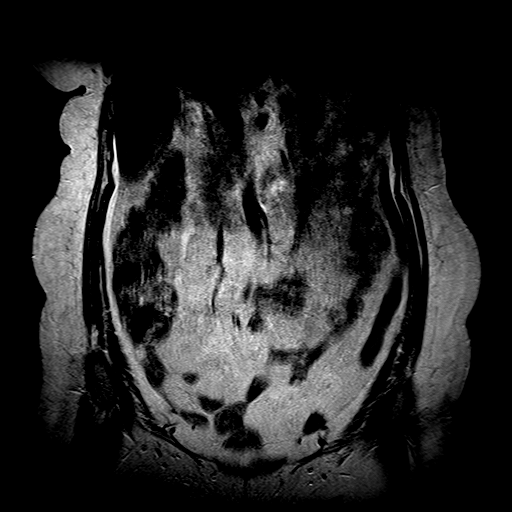

[Series 9: T2 · axial · 4.0mm · 0.52mm/px · z∈[-40,+180]mm · 11 of 26 slices shown (3 of 3)]
[im 1/26]
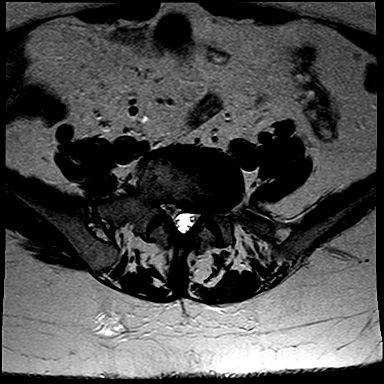
[im 3/26]
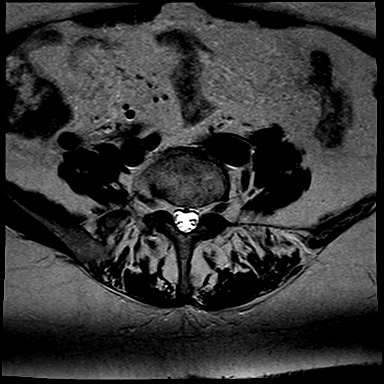
[im 6/26]
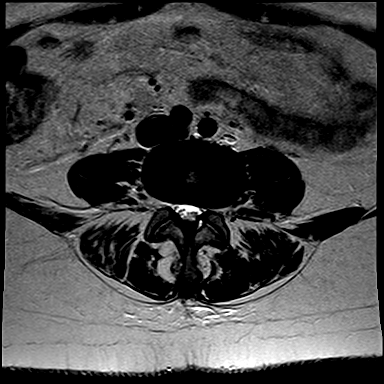
[im 8/26]
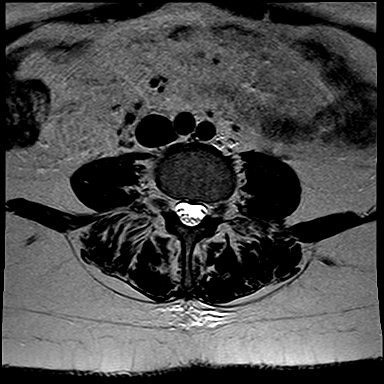
[im 11/26]
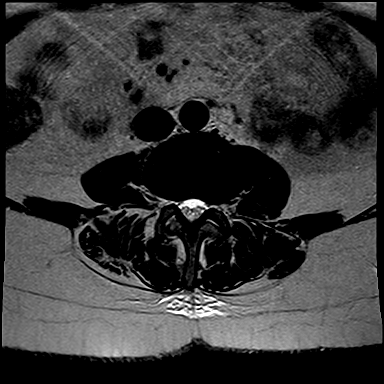
[im 13/26]
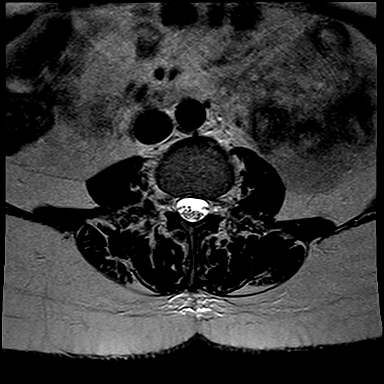
[im 16/26]
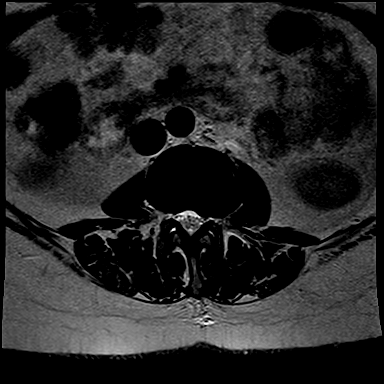
[im 18/26]
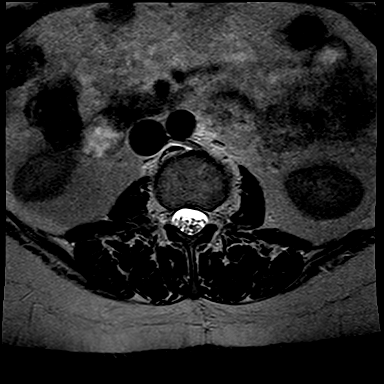
[im 21/26]
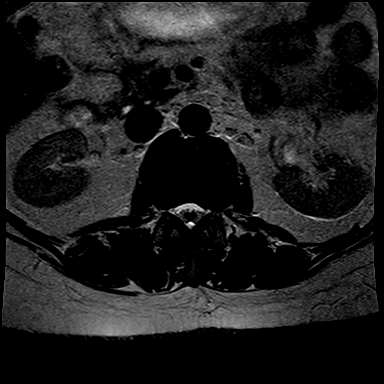
[im 23/26]
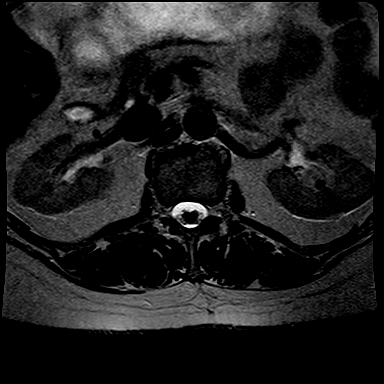
[im 26/26]
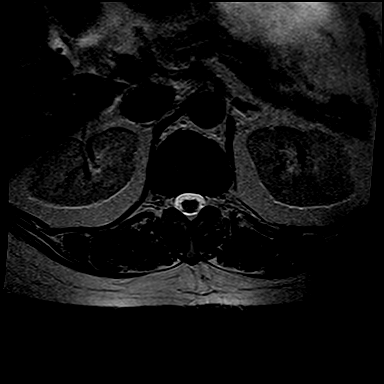

[Series 10: T1 · axial · 4.0mm · 0.52mm/px · 1 of 26 slices shown (2 of 2)]
[im 1/26]
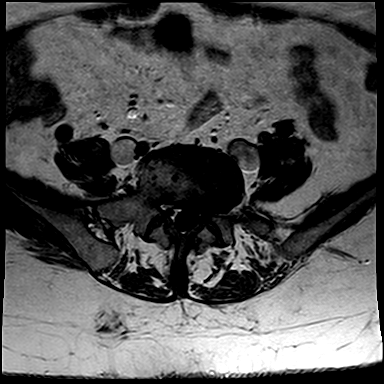

[32 of 48 positions shown; findings below may reference images not displayed]

FINDINGS: Vertebral bodies are normal in height, alignment and signal intensity. There is no acute fracture or subluxation.  Distal spinal cord is normal in signal intensity and terminates normally at L1 vertebral body level. Spinal canal is congenitally narrow. 

At L1-2 level, there is a small broad-based central disc bulge mildly effacing the ventral thecal sac. There is no significant neural foraminal stenosis. 

At L2-3 level, there is a small broad-based central disc bulge mildly effacing the ventral thecal sac. There is mild bilateral neural foraminal stenosis from facet arthropathy and bulging annulus without nerve root impingement. 

At L3-4 level, there is a small broad-based central disc bulge mildly effacing the ventral thecal sac. There is mild bilateral neural foraminal stenosis from bulging annulus without nerve root impingement. 

At L4-5 level, there is a small broad-based central disc bulge mildly effacing the ventral thecal sac. There is mild bilateral neural foraminal stenosis from facet arthropathy and bulging annulus. 

At L5-S1 level, there is marked disc desiccation. There is a small broad-based central disc bulge mildly effacing the ventral thecal sac. There is mild-to-moderate bilateral neural foraminal stenosis from facet arthropathy and bulging annulus. 

Paraspinal soft tissues are unremarkable.
IMPRESSION: 1. Small disc bulges at multiple levels without significant spinal stenosis at any level. 

2. Multilevel neural foraminal stenosis as detailed above.

## 2021-07-16 ENCOUNTER — Other Ambulatory Visit: Payer: Self-pay

## 2021-07-16 ENCOUNTER — Inpatient Hospital Stay
Admission: RE | Admit: 2021-07-16 | Discharge: 2021-07-16 | Disposition: A | Discharge status: Home / Self Care | Payer: Medicare Other | Source: Ambulatory Visit | Attending: FAMILY PRACTICE | Admitting: FAMILY PRACTICE

## 2021-07-16 DIAGNOSIS — N644 Mastodynia: Secondary | ICD-10-CM

## 2021-07-16 DIAGNOSIS — N6314 Unspecified lump in the right breast, lower inner quadrant: Secondary | ICD-10-CM

## 2021-07-16 DIAGNOSIS — Z803 Family history of malignant neoplasm of breast: Secondary | ICD-10-CM

## 2021-07-16 DIAGNOSIS — N63 Unspecified lump in unspecified breast: Secondary | ICD-10-CM

## 2021-07-16 MED ORDER — GADOBUTROL 10 MMOL/10 ML (1 MMOL/ML) INTRAVENOUS SOLUTION
10.0000 mL | INTRAVENOUS | Status: AC
Start: 2021-07-16 — End: 2021-07-16
  Administered 2021-07-16: 7 mL via INTRAVENOUS

## 2021-07-23 ENCOUNTER — Other Ambulatory Visit (HOSPITAL_COMMUNITY): Payer: Self-pay

## 2021-07-23 LAB — COLOGUARD® COLON CANCER SCREEN: COLOGUARD RESULT: POSITIVE — AB

## 2021-09-18 IMAGING — US US THYROID
1 series · 14 of 25 positions shown · non-contrast
Comparison: None available.

﻿EXAM:  [id number]   US THYROID
INDICATION: Goiter.

[Series 1: us thyroid · 14 of 37 slices shown]
[im 1/37]
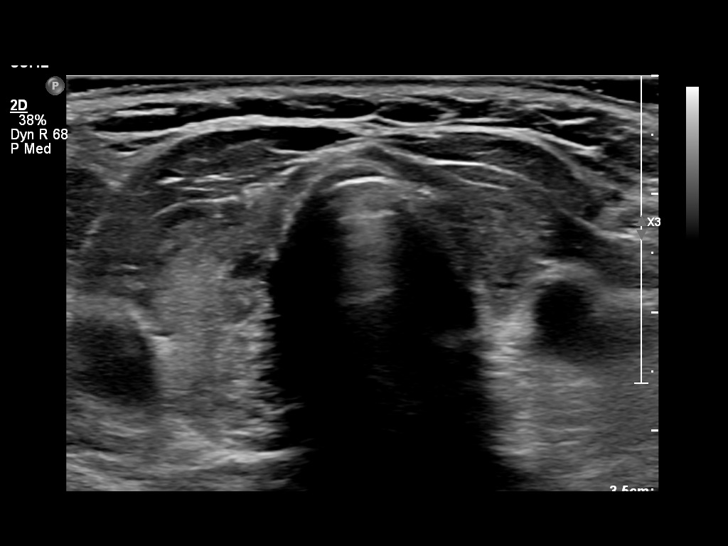
[im 4/37]
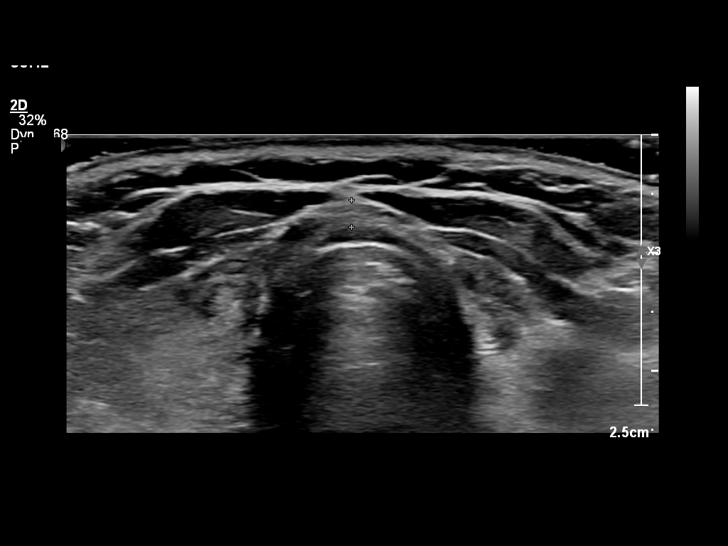
[im 7/37]
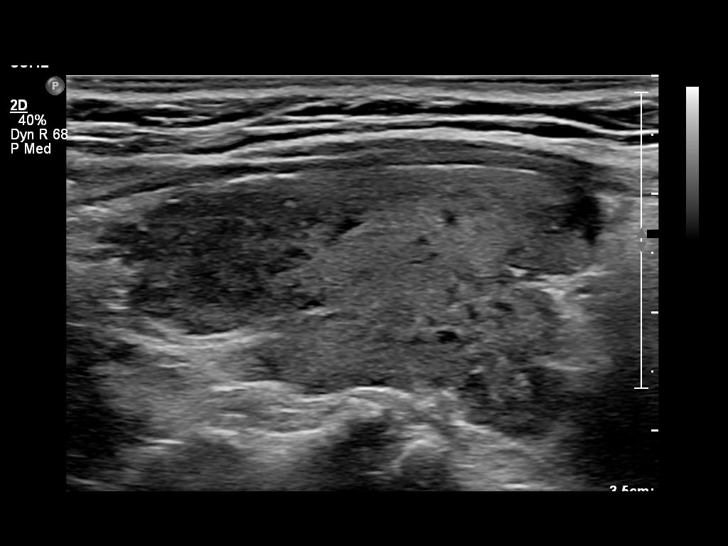
[im 10/37]
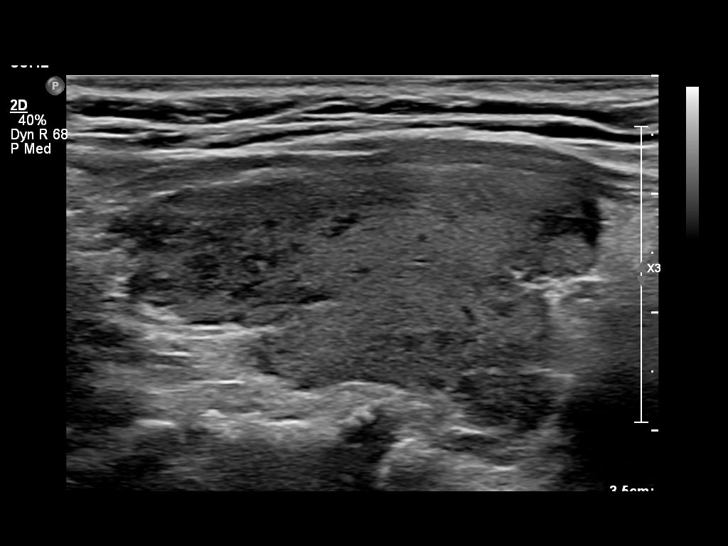
[im 13/37]
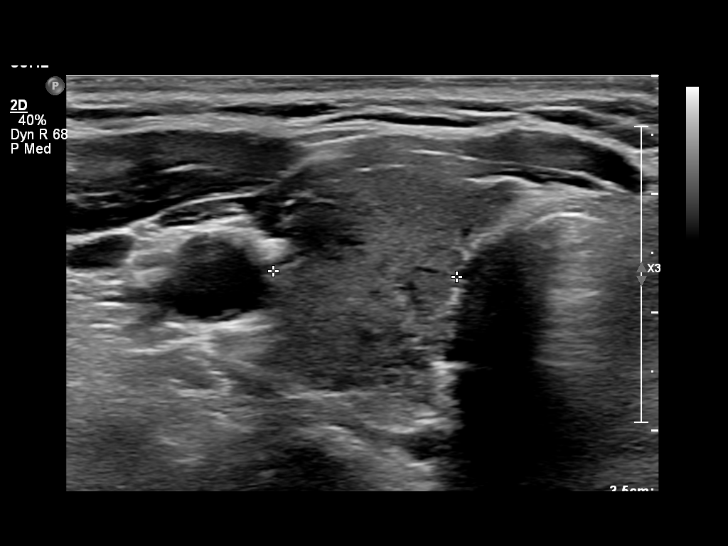
[im 14/37]
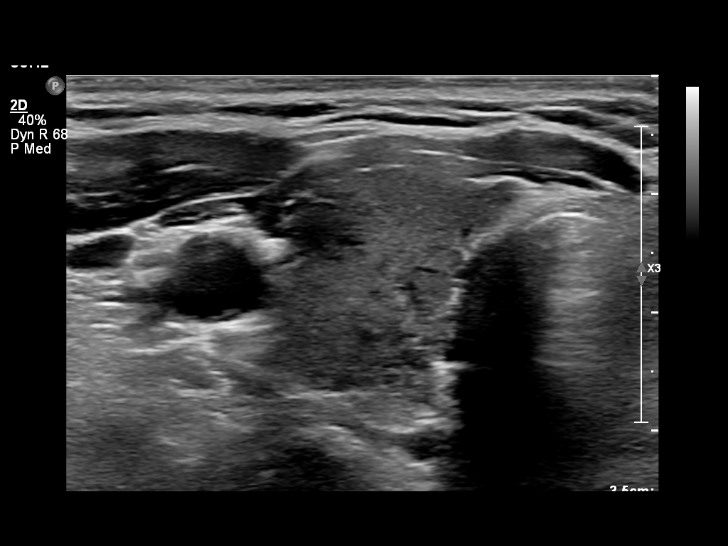
[im 17/37]
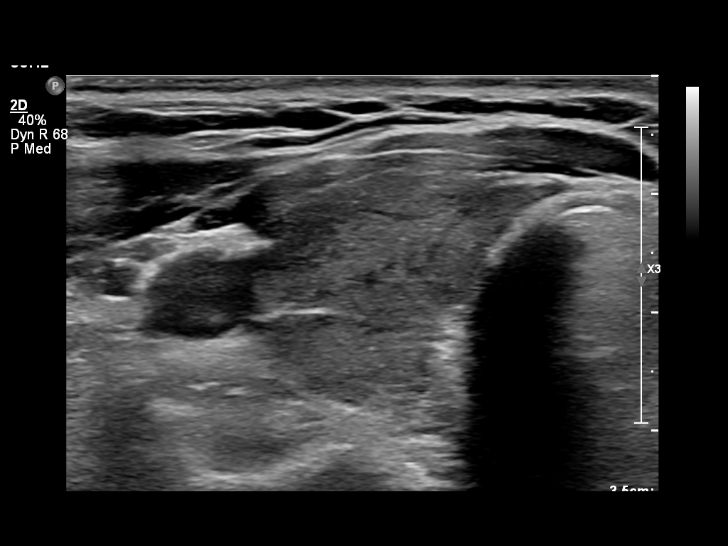
[im 20/37]
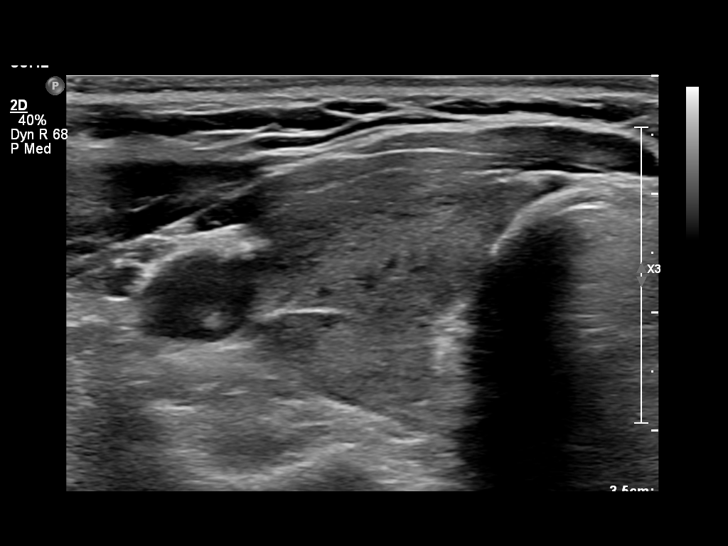
[im 23/37]
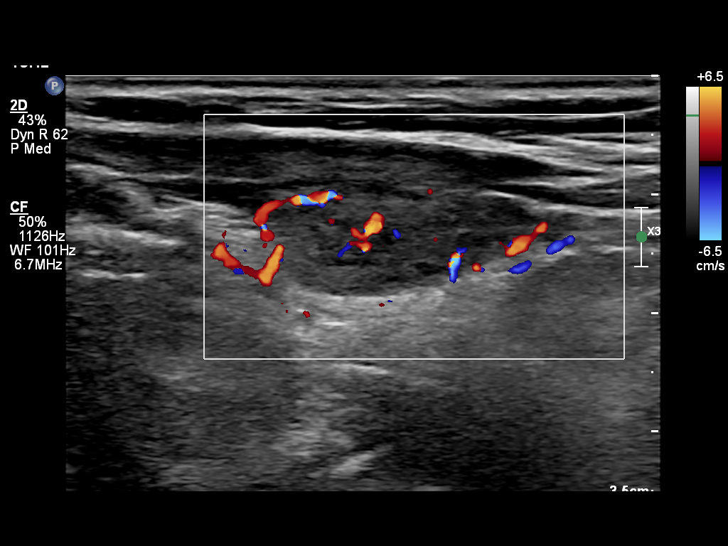
[im 25/37]
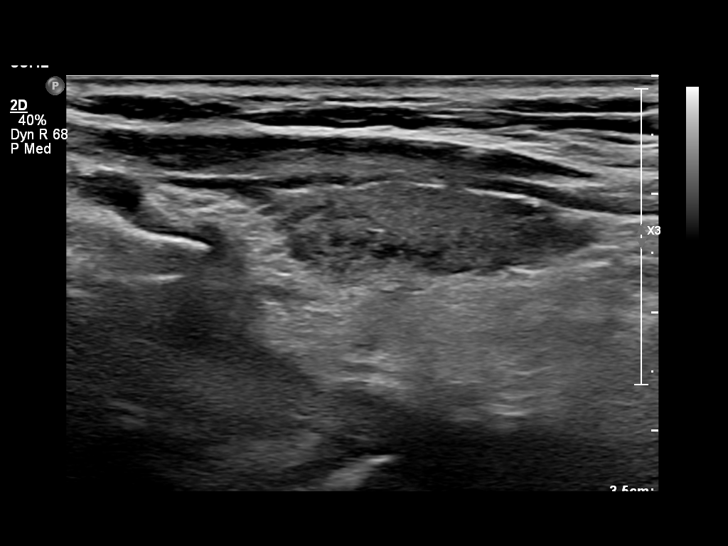
[im 28/37]
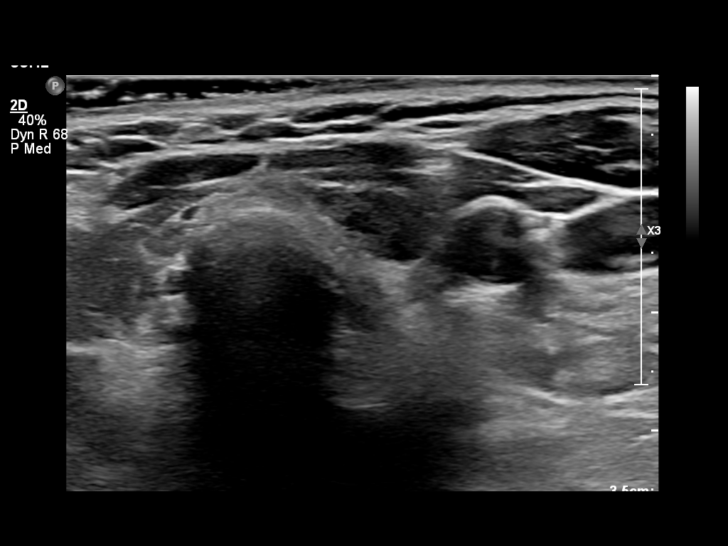
[im 31/37]
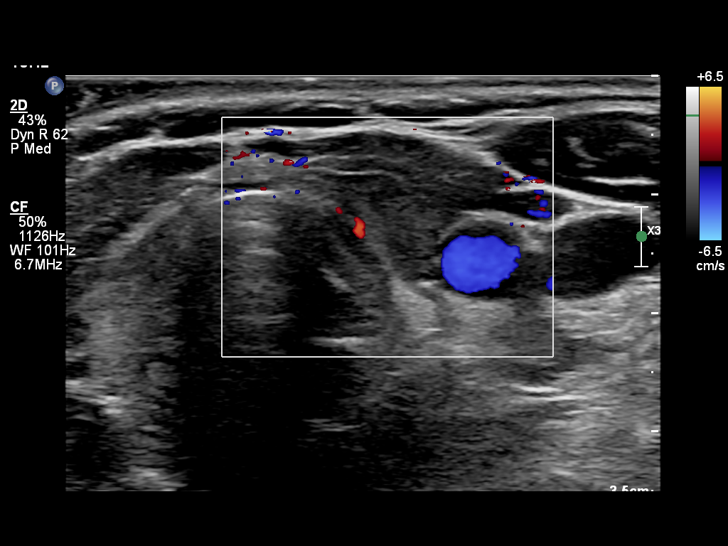
[im 34/37]
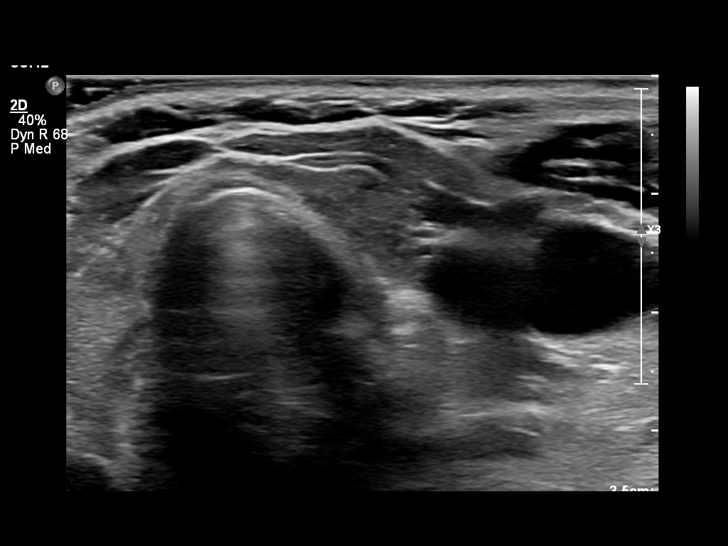
[im 37/37]
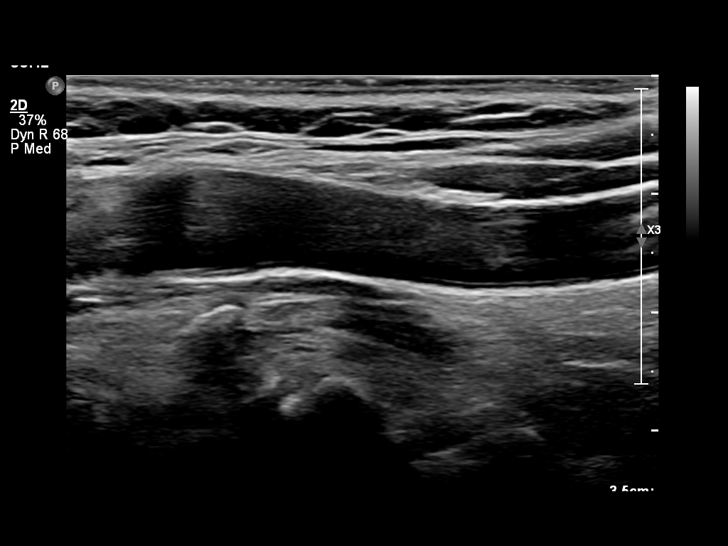

[14 of 25 positions shown; findings below may reference images not displayed]

FINDINGS: The thyroid appears normal in overall size measuring 4.2 x 2.0 x 1.6 cm on the right and 2.9 x 0.9 x 0.8 cm on the left.  

There is diffusely heterogeneous thyroid echotexture.  No mass, cyst, or thyroid calcification is seen.
IMPRESSION: 1. Mildly heterogeneous thyroid echotexture.  

2. Otherwise, unremarkable examination.

## 2021-09-28 ENCOUNTER — Encounter (INDEPENDENT_AMBULATORY_CARE_PROVIDER_SITE_OTHER): Payer: Self-pay | Admitting: Surgery

## 2021-09-29 ENCOUNTER — Ambulatory Visit (INDEPENDENT_AMBULATORY_CARE_PROVIDER_SITE_OTHER): Payer: Medicare Other | Admitting: Surgery

## 2021-09-29 ENCOUNTER — Encounter (INDEPENDENT_AMBULATORY_CARE_PROVIDER_SITE_OTHER): Payer: Self-pay | Admitting: Surgery

## 2021-09-29 ENCOUNTER — Other Ambulatory Visit: Payer: Self-pay

## 2021-09-29 VITALS — BP 161/96 | HR 88 | Temp 97.7°F | Resp 18 | Ht 62.0 in | Wt 172.0 lb

## 2021-09-29 DIAGNOSIS — M4800 Spinal stenosis, site unspecified: Secondary | ICD-10-CM

## 2021-09-29 DIAGNOSIS — R195 Other fecal abnormalities: Secondary | ICD-10-CM

## 2021-09-29 HISTORY — DX: Spinal stenosis, site unspecified: M48.00

## 2021-09-30 MED ORDER — SUTAB 1.479-0.188-0.225 GRAM TABLET
ORAL_TABLET | ORAL | 0 refills | Status: DC
Start: 2021-09-30 — End: 2021-12-23

## 2021-09-30 NOTE — Progress Notes (Signed)
GENERAL SURGERY, [facility]  [address]  [place] [address]    History and Physical     Name: Whitney Vang MRN:  [id number]   Date: 09/29/2021 Age: 71 y.o.            Reason for Visit: Colonoscopy (Positive cologuard)    History of Present Illness  Whitney Vang presents today for colonoscopy due to positive Cologuard testing.  It has been over 10 years since her last colonoscopy.  Patient denies any other problems her symptoms.      Positive diabetes (metformin)   Negative blood thinner, negative family history of colon cancer      Review of the result(s) of each unique test:  Patient underwent diagnostic testing ( none ) prior to this dates visit.  I have personally reviewed the results and that serves as a component of the medical decision making for this encounter       Review of prior external note(s) from each unique source:  Patients referral to this office including a recent assessment by the referring provider.  This was reviewed by me for this unique office visit for the indication and intent of the referral as well as any pertinent medical or surgical history relevant to the patients independent evaluation by me today.      Patient History  Past Medical History:   Diagnosis Date    Hyperlipidemia     Hypothyroidism     Prediabetes          Past Surgical History Pertinent Negatives:   Procedure Date Noted    HX BREAST BIOPSY 07/09/2021    HX BREAST LUMPECTOMY 07/09/2021    HX BREAST RECONSTRUCTION 07/09/2021    HX BREAST REDUCTION 07/09/2021    HX CYST INCISION AND DRAINAGE 07/09/2021    HX HYSTERECTOMY 07/09/2021    HX MASTECTOMY, SIMPLE 07/09/2021    HX OOPHORECTOMY 07/09/2021         Current Outpatient Medications   Medication Sig    levothyroxine (SYNTHROID) 50 mcg Oral Tablet     metFORMIN (GLUCOPHAGE XR) 500 mg Oral Tablet Sustained Release 24 hr     sod sulf-pot chloride-mag sulf (SUTAB) 1.479-0.188- 0.225 gram Oral Tablet Take 12 pills at 10:00am to 10:30am Take  12 pills at 6:00pm to 6:30pm. Drink plenty of water     No Known Allergies  Family Medical History:       Problem Relation (Age of Onset)    Breast Cancer Mother    No Known Problems Father, Sister, Brother, Maternal Grandmother, Maternal Grandfather, Paternal Grandmother, Paternal Grandfather, Daughter, Son, Maternal Aunt, Maternal Uncle, Paternal Aunt, Paternal Uncle, Other            Social History     Tobacco Use    Smoking status: Never    Smokeless tobacco: Never   Vaping Use    Vaping Use: Never used   Substance Use Topics    Alcohol use: Not Currently    Drug use: Not Currently            Physical Examination:  Vitals:    09/29/21 1524   BP: (!) 161/96   Pulse: 88   Resp: 18   Temp: 36.5 C (97.7 F)   Weight: 78 kg (172 lb)   Height: 1.575 m (5\' 2" )   BMI: 31.52        General: appropriate for age. in no acute distress.    Vital signs are present  above and have been reviewed by me     HEENT: Atraumatic, Normocephalic. PERRLA. EOMI. Nose clear. Throat clear    Lungs: Nonlabored breathing with symmetric expansion. Clear to auscultation bilaterally    Heart:Regular wth respect to rate and rythmn.    Abdomen:Soft. Nontender. Nondistended and benign    Extremities: Grossly normal. No major deformities     Neuro:  Grossly normal motor and sensory function    Psychiatric: Alert and oriented to person, place, and time. affect appropriate      Assessment and Plan  Positive Cologuard  Colonoscopy scheduled for 12/23/2021 at 12:00 p.m.   Follow Up:  No follow-ups on file.      ICD-10-CM    1. Positive colorectal cancer screening using Cologuard test  R19.5           Hillman Attig B Lakela Kuba, MD ,MBA,FACS    I appreciate the opportunity to be involved in the care of your patients.  If you have any questions or concerns regarding this encounter, please do not hesitate to contact me at your convenience.      This note may have been partially generated using M[employer] Fluency Direct system, and there may be some incorrect words,  spellings, and punctuation that were not noted in checking the note before saving, though effort was made to avoid such errors.

## 2021-11-03 ENCOUNTER — Inpatient Hospital Stay (HOSPITAL_COMMUNITY)
Admission: RE | Admit: 2021-11-03 | Discharge: 2021-11-03 | Disposition: A | Discharge status: Home / Self Care | Payer: Medicare Other | Source: Ambulatory Visit | Attending: FAMILY PRACTICE

## 2021-11-03 ENCOUNTER — Other Ambulatory Visit (HOSPITAL_COMMUNITY): Payer: Self-pay | Admitting: FAMILY PRACTICE

## 2021-11-03 ENCOUNTER — Inpatient Hospital Stay (HOSPITAL_COMMUNITY)
Admission: RE | Admit: 2021-11-03 | Discharge: 2021-11-03 | Disposition: A | Discharge status: Home / Self Care | Payer: Medicare Other | Source: Ambulatory Visit

## 2021-11-03 ENCOUNTER — Other Ambulatory Visit: Payer: Self-pay

## 2021-11-03 ENCOUNTER — Other Ambulatory Visit: Payer: Medicare Other | Attending: FAMILY PRACTICE

## 2021-11-03 DIAGNOSIS — M25561 Pain in right knee: Secondary | ICD-10-CM

## 2021-11-03 DIAGNOSIS — M542 Cervicalgia: Secondary | ICD-10-CM

## 2021-11-03 DIAGNOSIS — M25512 Pain in left shoulder: Secondary | ICD-10-CM | POA: Insufficient documentation

## 2021-11-03 DIAGNOSIS — R6884 Jaw pain: Secondary | ICD-10-CM

## 2021-11-03 LAB — TROPONIN-I: TROPONIN I: 5 ng/L (ref <15)

## 2021-11-11 ENCOUNTER — Other Ambulatory Visit (HOSPITAL_COMMUNITY): Payer: Self-pay | Admitting: FAMILY PRACTICE

## 2021-11-11 DIAGNOSIS — Z8249 Family history of ischemic heart disease and other diseases of the circulatory system: Secondary | ICD-10-CM

## 2021-11-11 DIAGNOSIS — M542 Cervicalgia: Secondary | ICD-10-CM

## 2021-11-11 DIAGNOSIS — M25519 Pain in unspecified shoulder: Secondary | ICD-10-CM

## 2021-11-11 DIAGNOSIS — R6884 Jaw pain: Secondary | ICD-10-CM

## 2021-11-11 DIAGNOSIS — E785 Hyperlipidemia, unspecified: Secondary | ICD-10-CM

## 2021-11-11 DIAGNOSIS — R609 Edema, unspecified: Secondary | ICD-10-CM

## 2021-11-13 ENCOUNTER — Other Ambulatory Visit (HOSPITAL_COMMUNITY): Payer: Self-pay | Admitting: FAMILY PRACTICE

## 2021-11-20 IMAGING — MR MRI CERVICAL SPINE WITHOUT CONTRAST
4 of 6 series · 22 of 48 positions shown · IV contrast (gadolinium)
Comparison: No prior imaging studies of the C-spine are available for comparison.

﻿EXAM:  01090   MRI CERVICAL SPINE WITHOUT CONTRAST
INDICATION: History of recent fall twice. Neck pain with radicular symptoms to left shoulder. Limited range of motion.  No prior history of malignancy or C-spine surgery.
TECHNIQUE: Multiplanar, multisequential MRI of the C-spine was performed without gadolinium contrast.

[Series 5: T2 · sagittal · 3.0mm · 0.75mm/px · 7 of 13 slices shown (1 of 2)]
[im 1/13]
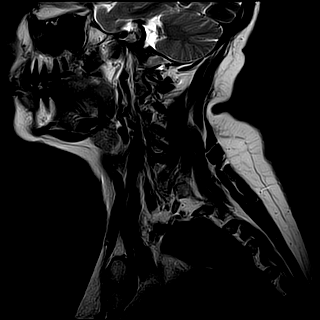
[im 3/13]
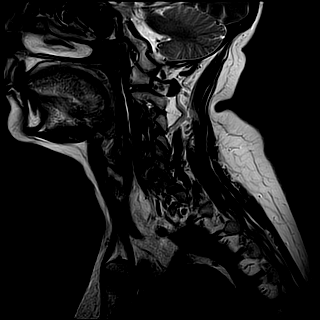
[im 5/13]
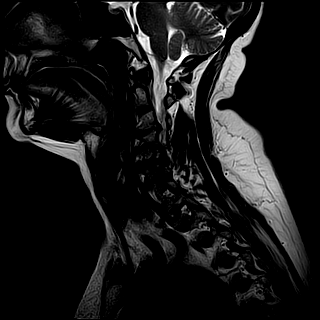
[im 7/13]
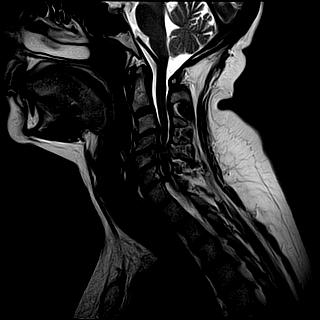
[im 9/13]
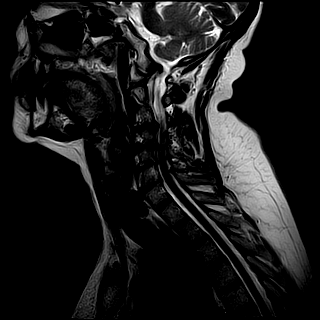
[im 11/13]
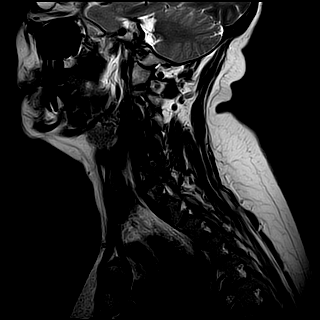
[im 13/13]
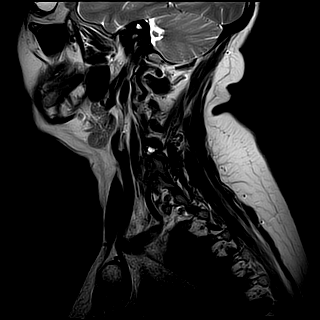

[Series 6: T1 · sagittal · 3.0mm · 0.47mm/px · 4 of 13 slices shown]
[im 1/13]
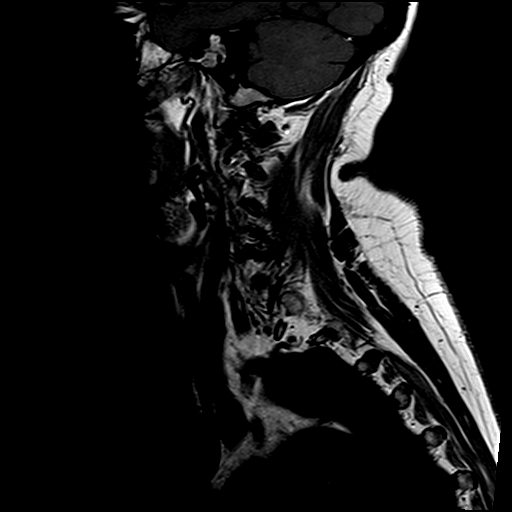
[im 3/13]
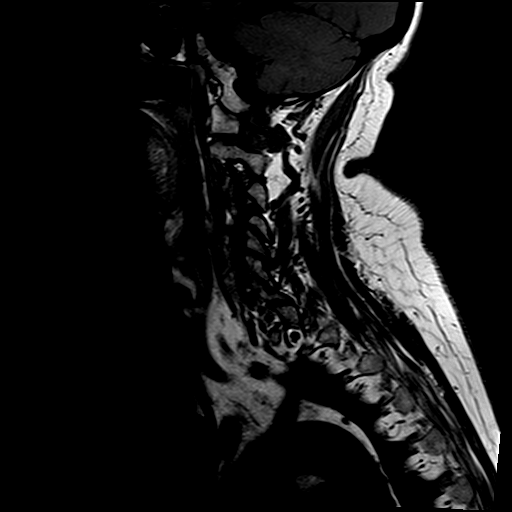
[im 7/13]
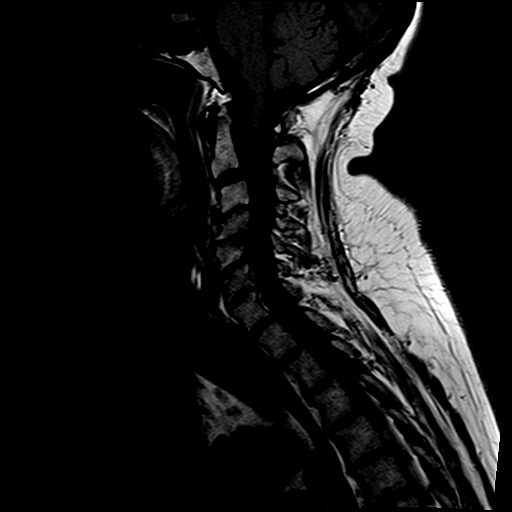
[im 11/13]
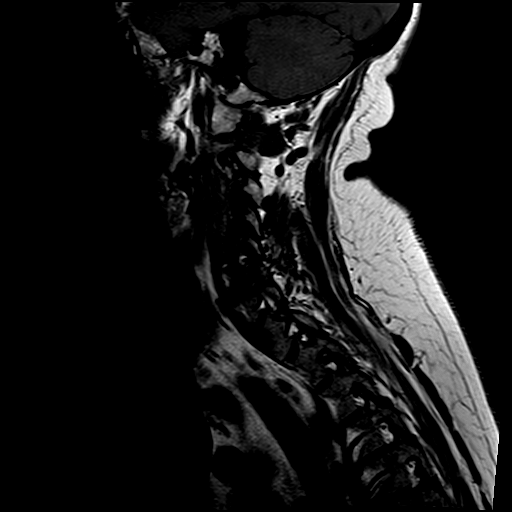

[Series 7: STIR · sagittal · 3.0mm · 0.47mm/px · 3 of 13 slices shown]
[im 3/13]
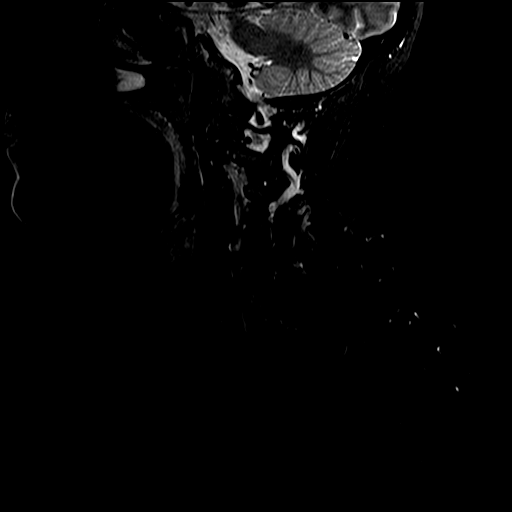
[im 7/13]
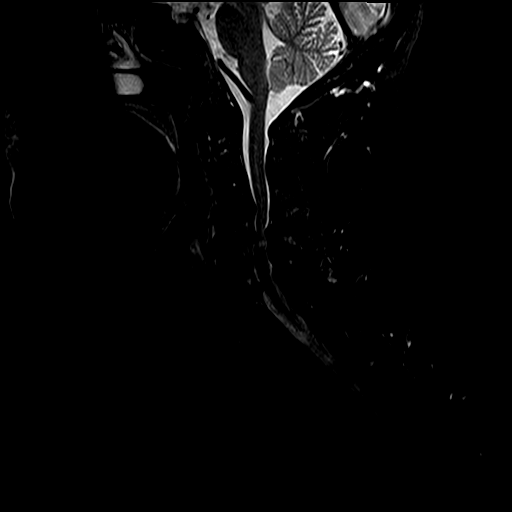
[im 11/13]
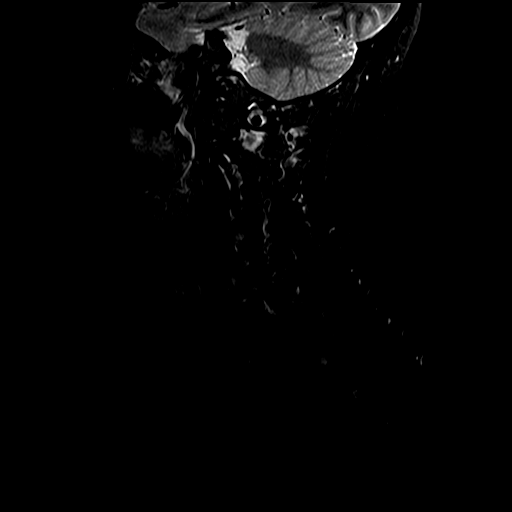

[Series 9: T2 · axial · 3.0mm · 0.39mm/px · z∈[-104,-1]mm · 8 of 18 slices shown (2 of 2)]
[im 1/18]
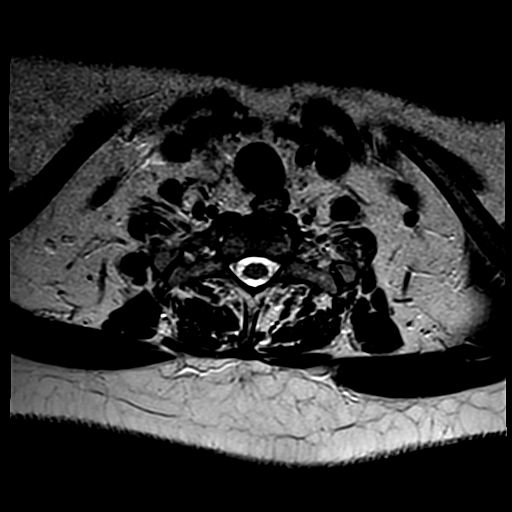
[im 2/18]
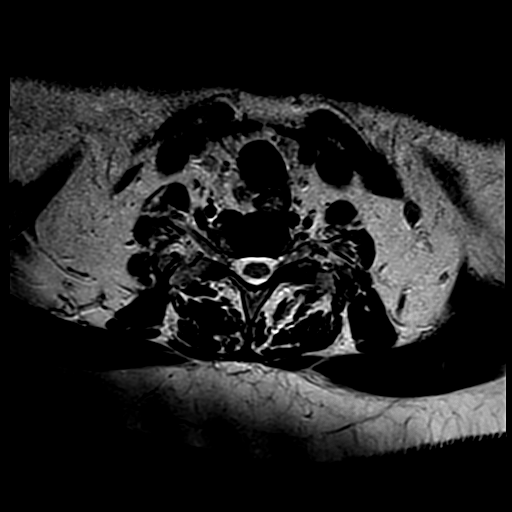
[im 6/18]
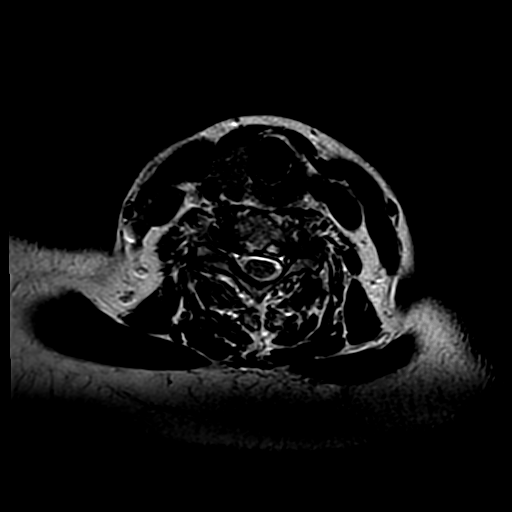
[im 8/18]
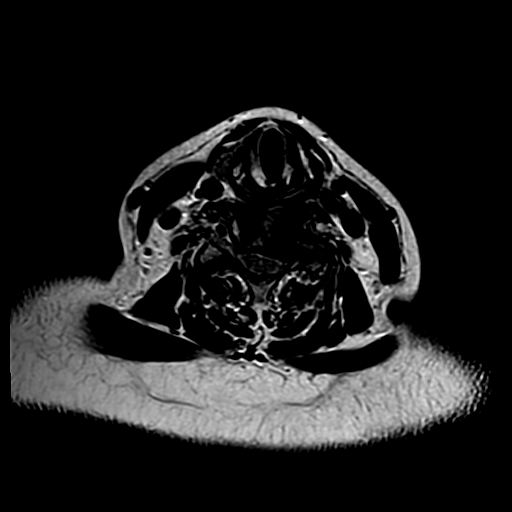
[im 10/18]
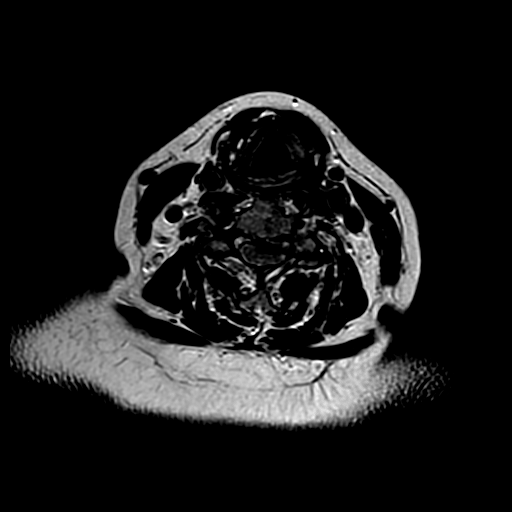
[im 12/18]
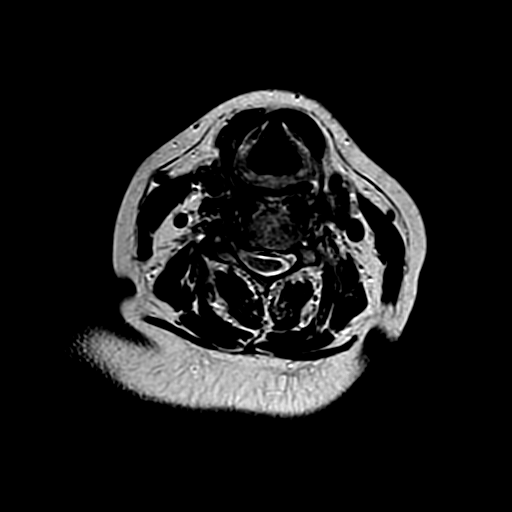
[im 16/18]
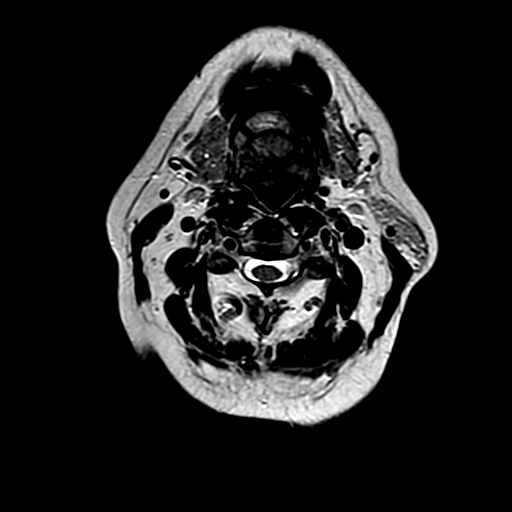
[im 18/18]
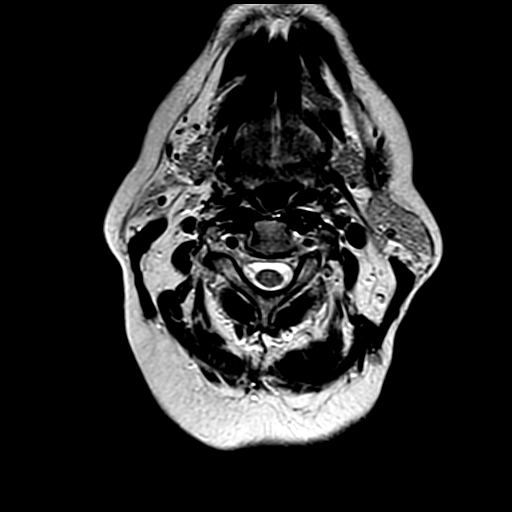

[22 of 48 positions shown; findings below may reference images not displayed]

FINDINGS: No acute bony lesions of cervical vertebrae are seen.  Posterior fossa and foramen magnum structures are normal in the sagittal images. 

At C2-3 level, no focal disc lesions are seen. 

At C3-4 level, significant degenerative disc disease with bulging annulus causing moderately significant compromise of thecal sac and neural foramina.  AP diameter of thecal sac in the midline measuring 7.2 mm. 

At C4-5 level, severe degenerative disc disease with osteophyte complex and facet arthropathy are noted.  Severe degree of spinal stenosis and severe compromise of lateral recess and neural foramina are noted due to the changes with AP diameter of thecal sac in the midline measuring 3.9 mm.  A 5 mm size cyst is noted from the facet joint on the right side suggestive of a synovial cyst.  Edema of the spinal cord due to extrinsic compression is noted at this level.

At C5-6 level, degenerative disc disease with disc osteophyte complex causing severe biforaminal stenosis, left worse than the right. 

At C6-7 level, degenerative disc disease with prominent disc osteophyte complex causing severe left foraminal stenosis.

C7-T1 disc is normal.

Cervical spinal cord shows cord edema due to extrinsic compression due to the degenerative changes at C4-5 level as mentioned above.
IMPRESSION: 1. No acute bone changes of cervical vertebrae. 

2. At C4-5 level, severe degenerative disc disease with osteophyte complex and facet arthropathy are noted.  Severe degree of spinal stenosis and severe compromise of lateral recess and neural foramina are noted due to the changes with AP diameter of thecal sac in the midline measuring 3.9 mm.  A 5 mm size cyst is noted from the facet joint on the right side suggestive of a synovial cyst.  Edema of the spinal cord due to extrinsic compression is noted at this level. 

3. Significant degenerative changes are noted at other levels as described above in detail at each level.

4. Cord edema at C4-5 level due to extrinsic compression from degenerative changes.

## 2021-11-21 ENCOUNTER — Other Ambulatory Visit: Payer: Self-pay

## 2021-11-24 ENCOUNTER — Other Ambulatory Visit (HOSPITAL_COMMUNITY): Payer: Self-pay | Admitting: Orthopaedic Surgery

## 2021-11-24 DIAGNOSIS — M25512 Pain in left shoulder: Secondary | ICD-10-CM

## 2021-12-08 ENCOUNTER — Other Ambulatory Visit: Payer: Self-pay

## 2021-12-08 ENCOUNTER — Inpatient Hospital Stay
Admission: RE | Admit: 2021-12-08 | Discharge: 2021-12-08 | Disposition: A | Discharge status: Home / Self Care | Payer: Medicare Other | Source: Ambulatory Visit | Attending: Orthopaedic Surgery | Admitting: Orthopaedic Surgery

## 2021-12-08 DIAGNOSIS — M19012 Primary osteoarthritis, left shoulder: Secondary | ICD-10-CM

## 2021-12-08 DIAGNOSIS — M25512 Pain in left shoulder: Secondary | ICD-10-CM | POA: Insufficient documentation

## 2021-12-08 DIAGNOSIS — M7552 Bursitis of left shoulder: Secondary | ICD-10-CM

## 2021-12-23 ENCOUNTER — Encounter (HOSPITAL_COMMUNITY): Payer: Medicare Other | Admitting: Surgery

## 2021-12-23 ENCOUNTER — Encounter (HOSPITAL_COMMUNITY)
Admission: RE | Disposition: A | Discharge status: Home / Self Care | Payer: Self-pay | Source: Ambulatory Visit | Attending: Surgery

## 2021-12-23 ENCOUNTER — Ambulatory Visit (HOSPITAL_COMMUNITY): Payer: Medicare Other | Admitting: Certified Registered"

## 2021-12-23 ENCOUNTER — Encounter (HOSPITAL_COMMUNITY): Payer: Self-pay | Admitting: Surgery

## 2021-12-23 ENCOUNTER — Inpatient Hospital Stay
Admission: RE | Admit: 2021-12-23 | Discharge: 2021-12-23 | Disposition: A | Discharge status: Home / Self Care | Payer: Medicare Other | Source: Ambulatory Visit | Attending: Surgery | Admitting: Surgery

## 2021-12-23 ENCOUNTER — Other Ambulatory Visit: Payer: Self-pay

## 2021-12-23 DIAGNOSIS — Z1211 Encounter for screening for malignant neoplasm of colon: Secondary | ICD-10-CM | POA: Insufficient documentation

## 2021-12-23 DIAGNOSIS — R195 Other fecal abnormalities: Secondary | ICD-10-CM

## 2021-12-23 SURGERY — COLONOSCOPY
Anesthesia: General | Wound class: Contaminated Wounds-Open, fresh, accidental wounds

## 2021-12-23 MED ORDER — PROPOFOL 10 MG/ML INTRAVENOUS EMULSION
Freq: Once | INTRAVENOUS | Status: DC | PRN
Start: 2021-12-23 — End: 2021-12-23
  Administered 2021-12-23: 10 mL via INTRAVENOUS

## 2021-12-23 MED ORDER — DEXTROSE 5 % AND LACTATED RINGERS INTRAVENOUS SOLUTION
INTRAVENOUS | Status: DC | PRN
Start: 2021-12-23 — End: 2021-12-23

## 2021-12-23 NOTE — OR Surgeon (Signed)
[facility]      Patient Name: Whitney Vang, Whitney Vang Number: [id number]  Date of Service: 12/23/2021   Date of Birth: 1950-07-31      Pre-Operative Diagnosis: POSITIVE COLOGUARD     Post-Operative Diagnosis: Normal Colonoscopy    Procedure(s)/Description:  COLONOSCOPY: 80881 (CPT)     Attending Surgeon: Willow Ora, MD     Anesthesia:  CRNA: Carmina Miller, CRNA    Anesthesia Type: .General     Specimens Removed: NONE    The patient indicates that they have read and understood the preoperative colonoscopy consent form. The benefits, risks and alternatives to the procedure were discussed. I specifically discussed the risk of bleeding and/or perforation requiring operation.  The patient was given ample opportunity to ask questions which were addressed to the patient's satisfaction.  The patient indicates they have no further question and wish to proceed. Informed consent was obtained from the patient and/or medical power of attorney.    The patient was brought into the procedure room and placed on the table in the left lateral decubitus position. After IV sedation was given, full finger digital rectal examination was performed with a circumferential sweep of the distal rectal mucosa. Subsequently, the flexible colonoscope was inserted into the rectum and passed without any difficulty. The colonoscope was then advanced up into the sigmoid colon, descending colon, transverse colon, right colon and cecum without any difficulty. Gross examination of each section of the colon was performed showing no specific abnormalities seen. Cecal intubation was achieved and the appendiceal orifice and ileocecal valve were identified. The colonoscope was withdrawn carefully examining the mucosa as the scope was being extracted with particular attention paid to the proximal sides of folds, flexures, bends and rectal valves. At approximately 10 cm. from the anal verge, the colonoscope was retroflexed to  fully examine the distal rectum. The colonoscope was removed and a repeat digital rectal examination was performed at the completion of the procedure. The patient tolerated the procedure well. No intraoperative complications were encountered.    EKG, pulse, pulse oximetry and blood pressure were monitored throughout the entire procedure.  There were no unplanned events.  The patient was instructed to contact me if they have any problems with their colon such as bleeding, pain or changes in bowel habits. They understood and agreed to do so.  The patient will not need another screening colonoscopy for 10 years which is according to ASGE guidelines. However, if in the future the patient has any problems with abdominal pain, changes in bowel habits, blood in stool, etc., then they should contact me because they may be a candidate for diagnostic colonoscopy before the 10 year time limit.    Karryn Kosinski B Javoni Lucken, MD,MBA,FACS

## 2021-12-23 NOTE — Discharge Instructions (Addendum)
SURGICAL DISCHARGE INSTRUCTIONS     Dr. Renette Butters, Gene B, MD  performed your COLONOSCOPY today at the [facility]:  Monday through Friday from 8 a.m. - 4 p.m.: [phone]    For T&D: [phone]  Between 4 p.m. - 8 a.m., weekends and holidays:  Call ER [phone]    PLEASE SEE WRITTEN HANDOUTS AS DISCUSSED BY YOUR NURSE:  Anderson Malta, RN      ANESTHESIA INFORMATION   ANESTHESIA -- ADULT PATIENTS:  You have received intravenous sedation / general anesthesia, and you may feel drowsy and light-headed for several hours. You may even experience some forgetfulness of the procedure. DO NOT DRIVE A MOTOR VEHICLE or perform any activity requiring complete alertness or coordination until you feel fully awake in about 24-48 hours. Do not drink alcoholic beverages for at least 24 hours. Do not stay alone, you must have a responsible adult available to be with you. You may also experience a dry mouth or nausea for 24 hours. This is a normal side effect and will disappear as the effects of the medication wear off.    REMEMBER   If you experience any difficulty breathing, chest pain, bleeding that you feel is excessive, persistent nausea or vomiting or for any other concerns:  Call your physician Dr.  Renette Butters, Bruna Potter, MD   at [phone] . You may also ask to have the general doctor on call paged. They are available to you 24 hours a day.      SPECIAL INSTRUCTIONS / COMMENTS   Today's findings: Normal Colonoscopy    Plan for repeat Colonoscopy in 10 years    FOLLOW-UP APPOINTMENTS   Please call your surgeon's office at the number listed to schedule a date / time of return for follow-up.     Dr Laqueta Due  [phone]

## 2021-12-23 NOTE — Anesthesia Preprocedure Evaluation (Signed)
ANESTHESIA PRE-OP EVALUATION  Planned Procedure: COLONOSCOPY  Review of Systems     anesthesia history negative               Pulmonary  negative pulmonary ROS,    Cardiovascular    Hyperlipidemia ,No peripheral edema,  Exercise Tolerance: > or = 4 METS        GI/Hepatic/Renal   negative GI/hepatic/renal ROS,         Endo/Other    prediabetes and hypothyroidism,      Neuro/Psych/MS   negative neuro/psych ROS,      Cancer    negative hematology/oncology ROS,                     Physical Assessment      Airway       Mallampati: I    TM distance: >3 FB    Neck ROM: full  Mouth Opening: good.            Dental       Dentition intact             Pulmonary    Breath sounds clear to auscultation  (-) no rhonchi, no decreased breath sounds, no wheezes, no rales and no stridor     Cardiovascular    Rhythm: regular  Rate: Normal  (-) no friction rub, carotid bruit is not present, no peripheral edema and no murmur     Other findings              Plan  ASA 2     Planned anesthesia type: general     total intravenous anesthesia                        Anesthetic plan and risks discussed with patient  signed consent obtained            Patient's NPO status is appropriate for Anesthesia.

## 2021-12-23 NOTE — H&P (Signed)
[facility]  General Surgery  History and Physical    Date of Service:  12/23/2021  Whitney Vang, Whitney Vang, 71 y.o. female  Date of Admission:  12/23/2021  Date of Birth:  Mar 15, 1950  PCP: Whitney Scheuermann, DO    Reason for admission:  Colonoscopy    HPI:  Whitney Vang is a 71 y.o. White female who is admitted for POSITIVE COLOGUARD     Whitney Vang presents today for colonoscopy due to positive Cologuard testing.  It has been over 10 years since her last colonoscopy.  Patient denies any other problems her symptoms.       Positive diabetes (metformin)   Negative blood thinner, negative family history of colon cancer        Review of the result(s) of each unique test:  Patient underwent diagnostic testing ( none ) prior to this dates visit.  I have personally reviewed the results and that serves as a component of the medical decision making for this encounter        Review of prior external note(s) from each unique source:  Patients referral to this office including a recent assessment by the referring provider.  This was reviewed by me for this unique office visit for the indication and intent of the referral as well as any pertinent medical or surgical history relevant to the patients independent evaluation by me today.    Past Medical History:   Diagnosis Date    Hyperlipidemia     Hypothyroidism     Prediabetes       No past surgical history on file.   Social History     Tobacco Use    Smoking status: Never    Smokeless tobacco: Never   Vaping Use    Vaping Use: Never used   Substance Use Topics    Alcohol use: Not Currently    Drug use: Not Currently       Family Medical History:       Problem Relation (Age of Onset)    Breast Cancer Mother    No Known Problems Father, Sister, Brother, Maternal Grandmother, Maternal Grandfather, Paternal Grandmother, Paternal 109, Daughter, Son, Maternal Aunt, Maternal Uncle, Paternal 50, Paternal Uncle, Other           Medications Prior to Admission        Prescriptions    levothyroxine (SYNTHROID) 50 mcg Oral Tablet    metFORMIN (GLUCOPHAGE XR) 500 mg Oral Tablet Sustained Release 24 hr    sod sulf-pot chloride-mag sulf (SUTAB) 1.479-0.188- 0.225 gram Oral Tablet    Take 12 pills at 10:00am to 10:30am Take 12 pills at 6:00pm to 6:30pm. Drink plenty of water           No Known Allergies       No data found.       General: appropriate for age. in no acute distress.    Vital signs are present above and have been reviewed by me     HEENT: Atraumatic, Normocephalic. PERRLA, EOMI. Nose clear. Throat clear.    Lungs: Nonlabored breathing with symmetric expansion.  Clear to auscultation bilaterally    Heart:Regular wth respect to rate and rythmn.    Abdomen:Soft. Nontender. Nondistended and benign    Extremities:  Grossly normal with good range of motion and no major deformities.    Neuro:  Grossly normal motor and sensory function. CN's II through XII intact.    Psychiatric: Alert and oriented to person, place, and  time. affect appropriate    Laboratory Data:     No results found for any visits on 12/23/21 (from the past 24 hour(s)).    Imaging Studies:    No orders to display        Assessment/Plan:  POSITIVE COLOGUARD    Colonoscopy scheduled for Wednesday December 23, 2021    This note was partially created using voice recognition software and is inherently subject to errors including those of syntax and "sound alike " substitutions which may escape proof reading. In such instances, original meaning may be extrapolated by contextual derivation.    Fidela Juneau, MD, MBA, FACS

## 2021-12-23 NOTE — Anesthesia Postprocedure Evaluation (Signed)
Anesthesia Post Op Evaluation    Patient: Whitney Vang  Procedure(s):  COLONOSCOPY    Last Vitals:Temperature: 36.3 C (97.4 F) (12/23/21 1357)  Heart Rate: 73 (12/23/21 1357)  BP (Non-Invasive): 110/65 (12/23/21 1357)  Respiratory Rate: 20 (12/23/21 1357)  SpO2: 99 % (12/23/21 1357)    No notable events documented.    Patient is sufficiently recovered from the effects of anesthesia to participate in the evaluation and has returned to their pre-procedure level.  Patient location during evaluation: PACU       Patient participation: complete - patient participated  Level of consciousness: awake and alert and responsive to verbal stimuli    Pain score: 0  Pain management: adequate  Airway patency: patent    Anesthetic complications: no  Cardiovascular status: acceptable  Respiratory status: acceptable  Hydration status: acceptable  Patient post-procedure temperature: Pt Normothermic   PONV Status: Absent

## 2021-12-25 ENCOUNTER — Telehealth (INDEPENDENT_AMBULATORY_CARE_PROVIDER_SITE_OTHER): Payer: Self-pay | Admitting: Surgery

## 2022-06-21 IMAGING — MG 3D SCREENING MAMMO BIL AND TOMO
5 series · 7 of 24 positions shown · non-contrast
Comparison: 02/12/2024, 02/08/2023.

------------- REPORT [id number] -------------
﻿

EXAM:  3D SCREENING MAMMO BIL AND TOMO
INDICATION: Screening mammogram.

[L]
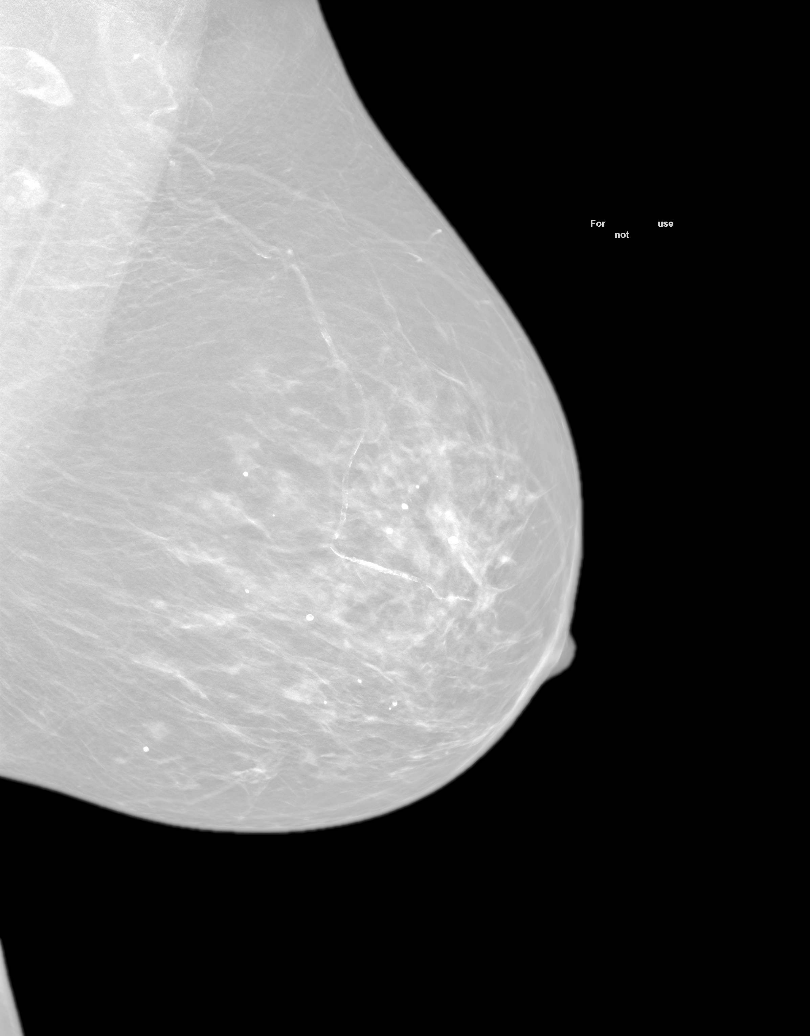

[R]
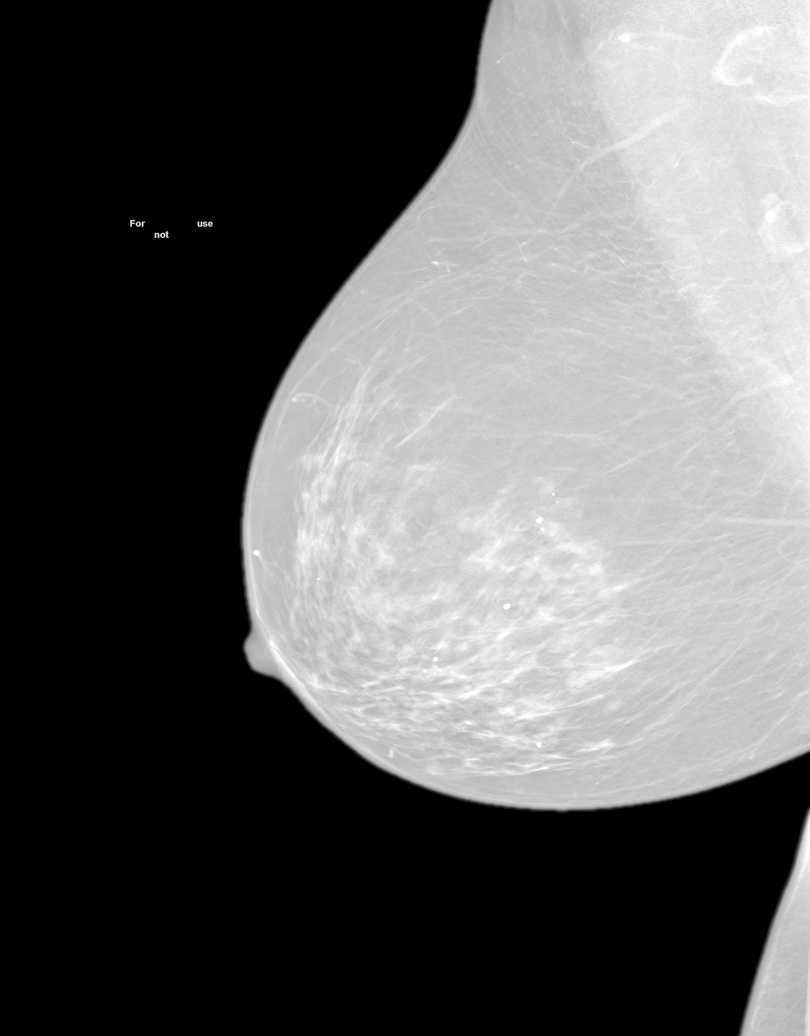

[R CC tomo · right · 0.10mm/px · 2 of 2 slices shown]
[im 1/2]
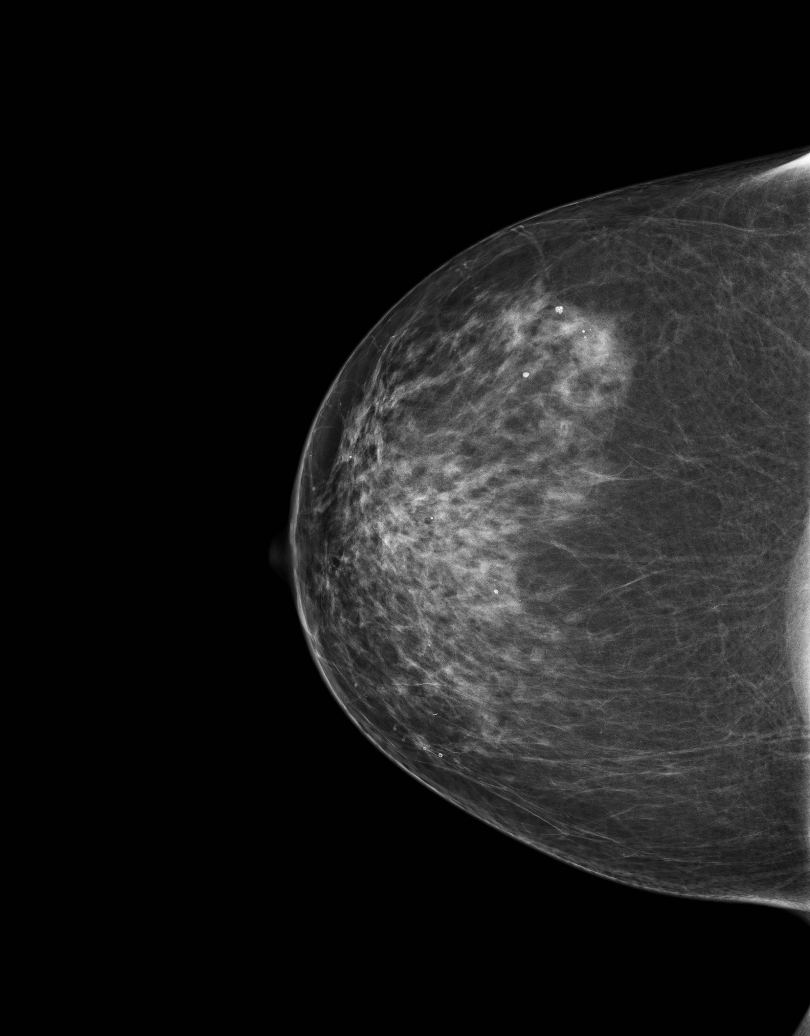
[im 2/2]
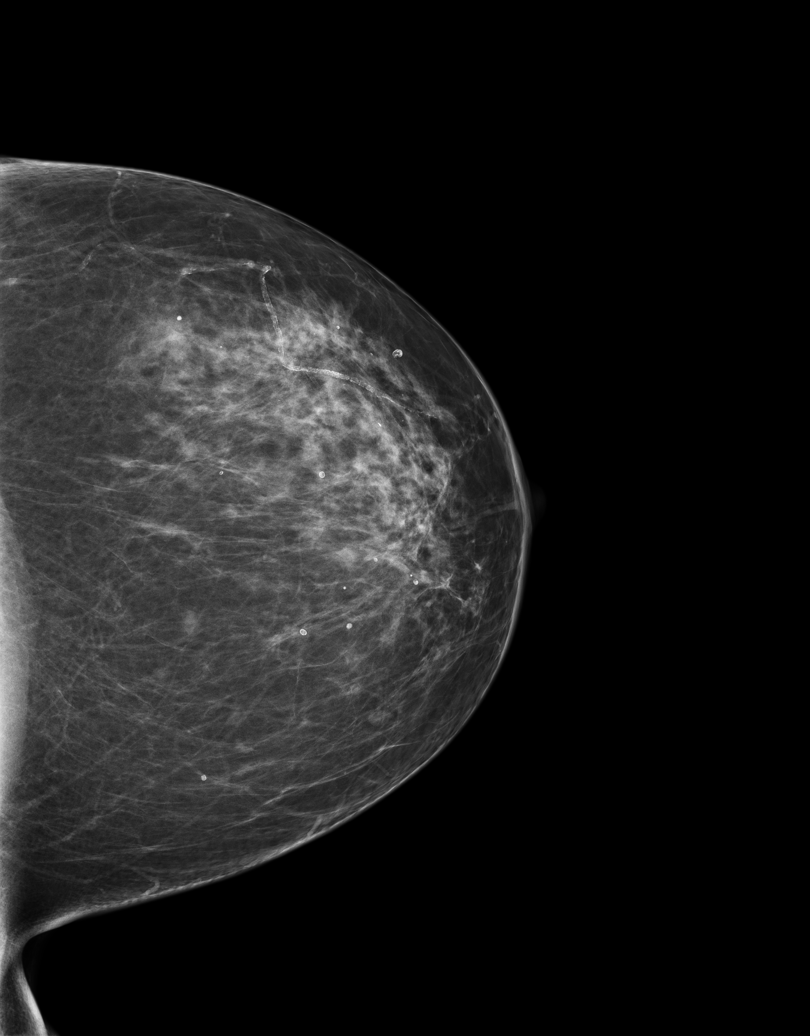

[3D SCREENING MAMMO BIL AND TOMO tomo · 2 acquisitions, 2 frames shown (1 of 2)]
[im 1/2]
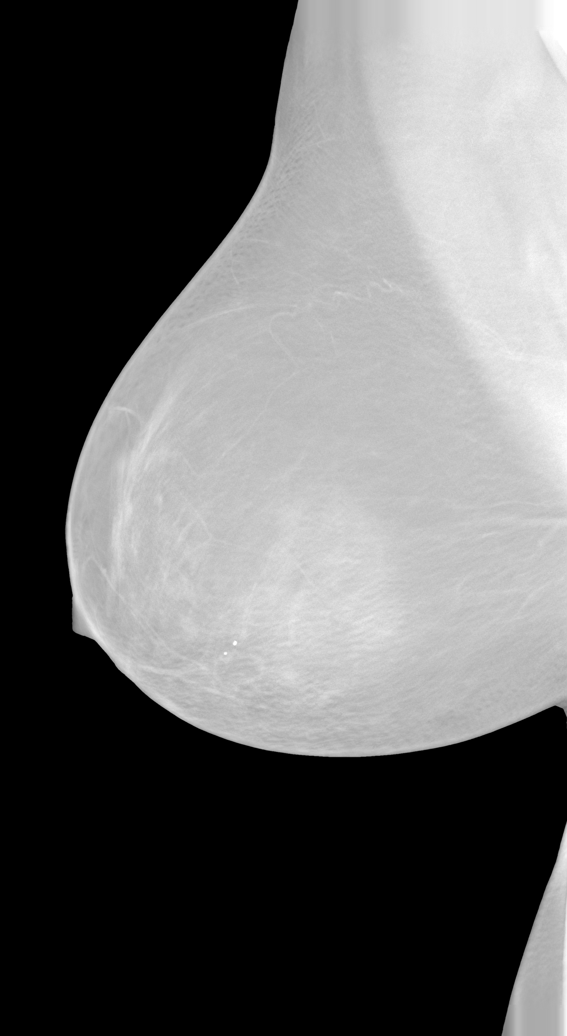
[im 2/2]
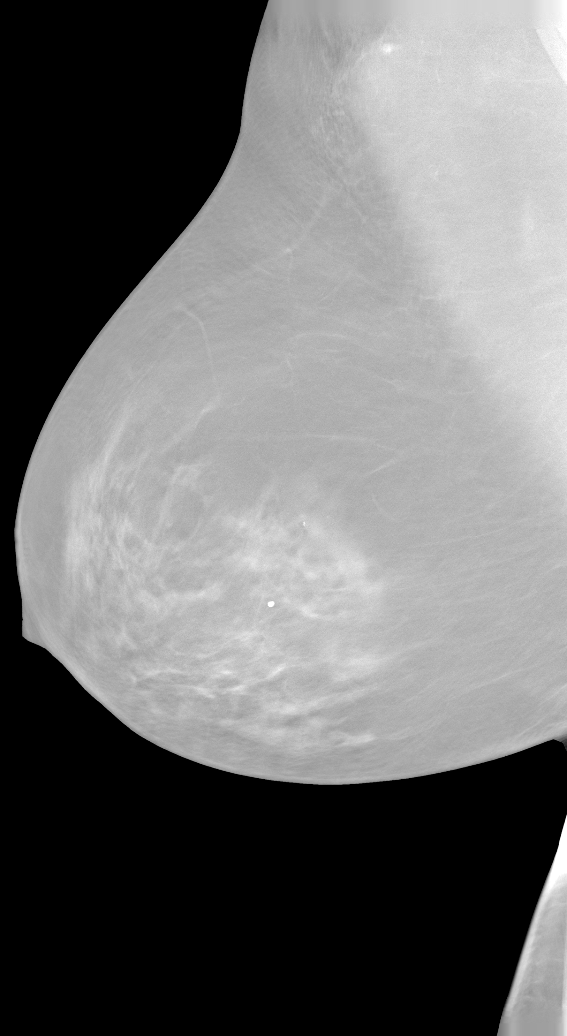

[3D SCREENING MAMMO BIL AND TOMO tomo (2 of 2) · tomo slice 14/89.0]
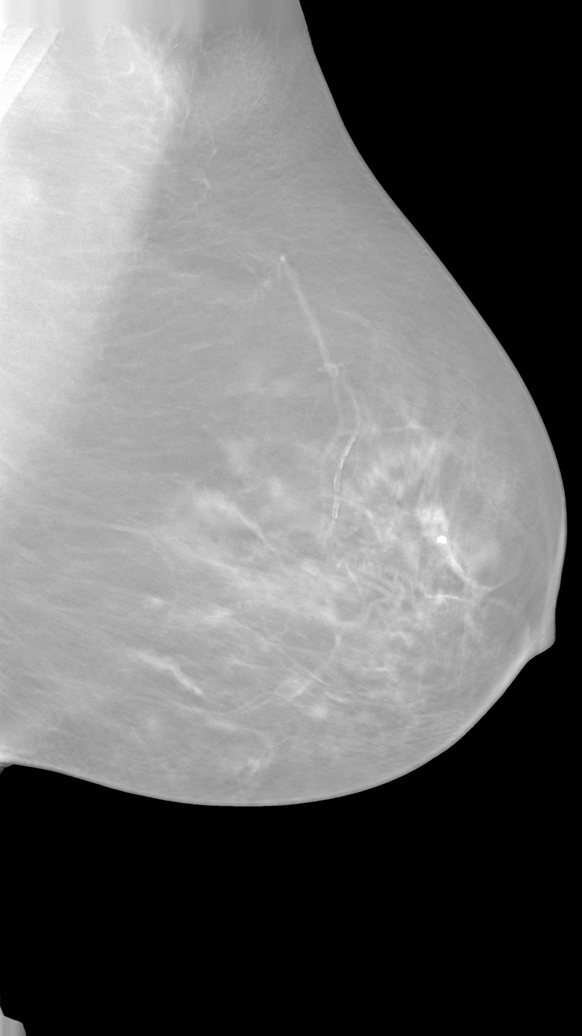

[7 of 24 positions shown; findings below may reference images not displayed]

FINDINGS: Breast parenchyma is heterogeneously dense.  There is no mass or suspicious cluster of microcalcifications.  There is no architectural distortion, skin thickening or nipple retraction.
IMPRESSION: 1.  BIRADS 2-Benign findings. Patient has been added in a reminder system with a target date for the next screening mammography.

2.  DENSITY CODE: C (Heterogeneously dense). 

Final Assessment Code:

BI-RADS 0
 Need additional imaging evaluation.

BI-RADS 1
 Negative mammogram.

BI-RADS 2
 Benign finding.

BI-RADS 3
 Probably benign finding; short-interval follow-up suggested.

BI-RADS 4
 Suspicious abnormality; biopsy should be considered.

BI-RADS 5
 Highly suggestive of malignancy; appropriate action should be taken.

BI-RADS 6
 Known biopsy-proven malignancy; appropriate action should be taken.

NOTE:
In compliance with Federal regulations, the results of this mammogram are being sent to the patient.

------------- REPORT [id number] -------------
[facility]
[address]
[place]
We wish to report the following on your recent mammography examination. We are sending a report to your referring physician or other health care provider.
FINDING: Normal/Negative-No evidence of cancer.

This statement is mandated by the [place], Department of Health.
Your examination was performed by one of our technologists, who are registered radiological technologists and also specially certified in mammography:
___
Machorro, Scottie (M)

Your mammogram was interpreted by our radiologist.

( 
Denizci Elbeyi, M.D.

(Annual Breast Examination by a physician or other health care provider
(Annual Mammography Screening beginning at age 40
(Monthly Breast Self Examination

## 2022-09-22 ENCOUNTER — Other Ambulatory Visit: Payer: Self-pay

## 2022-09-22 ENCOUNTER — Ambulatory Visit (INDEPENDENT_AMBULATORY_CARE_PROVIDER_SITE_OTHER): Payer: Medicare Other | Admitting: Internal Medicine

## 2022-09-22 ENCOUNTER — Encounter (INDEPENDENT_AMBULATORY_CARE_PROVIDER_SITE_OTHER): Payer: Self-pay | Admitting: Internal Medicine

## 2022-09-22 VITALS — BP 137/89 | HR 71 | Temp 98.4°F | Resp 18 | Ht 61.25 in | Wt 174.0 lb

## 2022-09-22 VITALS — BP 137/89 | HR 67 | Temp 98.4°F | Resp 18 | Ht 61.25 in | Wt 174.0 lb

## 2022-09-22 DIAGNOSIS — R7303 Prediabetes: Secondary | ICD-10-CM

## 2022-09-22 DIAGNOSIS — E559 Vitamin D deficiency, unspecified: Secondary | ICD-10-CM

## 2022-09-22 DIAGNOSIS — E039 Hypothyroidism, unspecified: Secondary | ICD-10-CM

## 2022-09-22 DIAGNOSIS — M4802 Spinal stenosis, cervical region: Secondary | ICD-10-CM

## 2022-09-22 DIAGNOSIS — Z803 Family history of malignant neoplasm of breast: Secondary | ICD-10-CM

## 2022-09-22 DIAGNOSIS — Z0000 Encounter for general adult medical examination without abnormal findings: Secondary | ICD-10-CM

## 2022-09-22 DIAGNOSIS — E785 Hyperlipidemia, unspecified: Secondary | ICD-10-CM

## 2022-09-22 DIAGNOSIS — M8580 Other specified disorders of bone density and structure, unspecified site: Secondary | ICD-10-CM

## 2022-09-22 DIAGNOSIS — Z1159 Encounter for screening for other viral diseases: Secondary | ICD-10-CM

## 2022-09-22 MED ORDER — ATORVASTATIN 20 MG TABLET
20.0000 mg | ORAL_TABLET | Freq: Every day | ORAL | 3 refills | Status: DC
Start: 2022-09-22 — End: 2023-09-28

## 2022-09-22 MED ORDER — CENTRUM SILVER WOMEN 8 MG IRON-400 MCG-50 MCG TABLET
1.0000 | ORAL_TABLET | Freq: Every day | ORAL | Status: AC
Start: 2022-09-22 — End: ?

## 2022-09-22 NOTE — Nursing Note (Signed)
09/22/22 1005   Comprehensive Health Assessment-Adult   Do you wish to complete this form? Yes   During the past 4 weeks, how would you rate your health in general? Excellent   During the past 4 weeks, how much difficulty have you had doing your usual activities inside and outside your home because of medical or emotional problems? No difficulty at all   During the past 4 weeks, was someone available to help you if you needed and wanted help? Yes, as much as I wanted   In the past year, how many times have you gone to the emergency department or been admitted to a hospital for a health problem? None   Are you generally satisfied with your sleep? Yes   Do you have enough money to buy things you need in everyday life, such as food, clothing, medicines, and housing? Yes, always   Can you get to places beyond walking distance without help?  (For example, can you drive your own car or travel alone on buses)? Yes   Do you fasten your seatbelt when you are in a car? Yes, usually   Do you exercise 20 minutes 3 or more days per week (such as walking, dancing, biking, mowing grass, swimming)? Yes, most of the time   How often do you eat food that is healthy (fruits, vegetables, lean meats) instead of unhealthy (sweets, fast food, junk food, fatty foods)? Almost always   Have your parents, brothers or sisters had any of the following problems before the age of 34? (check all that apply) Heart problems, or hardening of the arteries;Diabetes (sugar);Cancer;High cholesterol   How often do you have trouble taking medicines the eay you are told to take them? I always take them as prescribed   Do you need any help communicating with your doctors and nurses because of vision or hearing problems? No   During the past 12 months, have you experienced confusion or memory loss that is happening more often or is getting worse? No   Do you have one person you think of as your personal doctor (primary care provider or family doctor)? Yes    If you are seeing a Primary Care Provider (PCP) or family doctor. please list their name Bobbe Medico   Are you now also seeing any specialist physician(s) (such as eye doctor, foot doctor, skin doctor)? Yes   If you are seeing a specialist for anything such as foot, eye, skin, etc.  please list their name(s) Dr. Michelle Nasuti, Dr. Daphine Deutscher, Eye doctor   How confident are you that you can control or manage most of your health problems? Very confident

## 2022-09-22 NOTE — Progress Notes (Signed)
FAMILY MEDICINE, MEDICAL OFFICE BUILDING  [address]  [place] [address]    Medicare Annual Wellness Visit    Name: Whitney Vang MRN:  [id number]   Date: 09/22/2022 Age: 72 y.o.       SUBJECTIVE:   Whitney Vang is a 72 y.o. female for presenting for Medicare Wellness exam.   I have reviewed and reconciled the medication list with the patient today.        09/22/2022    10:05 AM   Comprehensive Health Assessment-Adult   Do you wish to complete this form? Yes   During the past 4 weeks, how would you rate your health in general? Excellent   During the past 4 weeks, how much difficulty have you had doing your usual activities inside and outside your home because of medical or emotional problems? No difficulty at all   During the past 4 weeks, was someone available to help you if you needed and wanted help? Yes, as much as I wanted   In the past year, how many times have you gone to the emergency department or been admitted to a hospital for a health problem? None   Are you generally satisfied with your sleep? Yes   Do you have enough money to buy things you need in everyday life, such as food, clothing, medicines, and housing? Yes, always   Can you get to places beyond walking distance without help?  (For example, can you drive your own car or travel alone on buses)? Yes   Do you fasten your seatbelt when you are in a car? Yes, usually   Do you exercise 20 minutes 3 or more days per week (such as walking, dancing, biking, mowing grass, swimming)? Yes, most of the time   How often do you eat food that is healthy (fruits, vegetables, lean meats) instead of unhealthy (sweets, fast food, junk food, fatty foods)? Almost always   Have your parents, brothers or sisters had any of the following problems before the age of 67? (check all that apply) Heart problems, or hardening of the arteries;Diabetes (sugar);Cancer;High cholesterol   How often do you have trouble taking medicines the eay you are told to take them? I  always take them as prescribed   Do you need any help communicating with your doctors and nurses because of vision or hearing problems? No   During the past 12 months, have you experienced confusion or memory loss that is happening more often or is getting worse? No   Do you have one person you think of as your personal doctor (primary care provider or family doctor)? Yes   If you are seeing a Primary Care Provider (PCP) or family doctor. please list their name Bobbe Medico   Are you now also seeing any specialist physician(s) (such as eye doctor, foot doctor, skin doctor)? Yes   If you are seeing a specialist for anything such as foot, eye, skin, etc.  please list their name(s) Dr. Michelle Nasuti, Dr. Daphine Deutscher, Eye doctor   How confident are you that you can control or manage most of your health problems? Very confident       I have reviewed and updated as appropriate the past medical, family and social history. 09/22/2022 as summarized below:  Past Medical History:   Diagnosis Date    Hyperlipidemia     Hypothyroidism     Prediabetes     Spinal stenosis     [facility]  Past Surgical History:   Procedure Laterality Date    Hx wisdom teeth extraction       Current Outpatient Medications   Medication Sig    acetaminophen (TYLENOL) 500 mg Oral Tablet Take 1 Tablet (500 mg total) by mouth Twice per day as needed for Pain (Patient not taking: Reported on 09/22/2022)    levothyroxine (SYNTHROID) 50 mcg Oral Tablet Take 1 Tablet (50 mcg total) by mouth Every morning    metFORMIN (GLUCOPHAGE XR) 500 mg Oral Tablet Sustained Release 24 hr Take 1 Tablet (500 mg total) by mouth Once a day    multivit-min-iron-FA-vit K-lut (CENTRUM SILVER WOMEN) 8 mg iron-400 mcg-50 mcg Oral Tablet Take 1 Tablet by mouth Once a day     Family Medical History:       Problem Relation (Age of Onset)    Bone cancer Mother    Breast Cancer Mother    Diabetes Brother    Diabetes type II Mother, Brother, Brother, Brother, Brother    Heart Disease Mother,  Father, Brother, Brother, Brother, Brother    Hypertension (High Blood Pressure) Sister    Irregular heart beat Sister    No Known Problems Maternal Aunt, Maternal Uncle, Paternal Aunt, Paternal Uncle, Maternal Grandmother, Maternal Grandfather, Paternal Grandmother, Paternal Grandfather, Daughter, Son, Other    Pacemaker Brother            Social History     Socioeconomic History    Marital status: Divorced   Tobacco Use    Smoking status: Never    Smokeless tobacco: Never   Vaping Use    Vaping status: Never Used   Substance and Sexual Activity    Alcohol use: Not Currently    Drug use: Not Currently     Social Determinants of Health     Health Literacy: Low Risk  (09/22/2022)    Health Literacy     SDOH Health Literacy: Never         List of Current Health Care Providers   Care Team       PCP       Name Type Specialty Phone Number    Magdalene Patricia, MD Physician [facility] [phone]              Care Team       No care team found                      Health Maintenance   Topic Date Due    Osteoporosis screening  Never done    Hepatitis C screening  Never done    Adult Tdap-Td (1 - Tdap) Never done    Pneumococcal Vaccination, Age 28+ (1 of 1 - PCV) Never done    Covid-19 Vaccine (4 - 2023-24 season) 10/23/2021    Medicare Annual Wellness Visit - Calendar Year Insurers  Never done    Influenza Vaccine (1) 10/24/2022    Breast Cancer Screening  07/10/2023    Depression Screening  09/22/2023    Colonoscopy  12/24/2031    Shingles Vaccine  Completed    Meningococcal Vaccine  Aged Out     Medicare Wellness Assessment   Medicare initial or wellness physical in the last year?: No  Advance Directives   Does patient have a living will or MPOA: No           Advance directive information given to the patient today?: Yes      Activities of Daily Living  Do you need help with dressing, bathing, or walking?: No   Do you need help with shopping, housekeeping, medications, or finances?: No   Do you have rugs in  hallways, broken steps, or poor lighting?: No   Do you have grab bars in your bathroom, non-slip strips in your tub, and hand rails on your stairs?: Yes (hand rails on stairs on porch no steps in house)   Cognitive Function Screen (1=Yes, 0=No)   What is you age?: Correct (71)   What is the time to the nearest hour?: Correct (10:00)   What is the year?: Correct (2024)   What is the name of this clinic?: Correct [facility])   Can the patient recognize two persons (the doctor, the nurse, home help, etc.)?: Correct   What is the date of your birth? (day and month sufficient) : Correct (03/09/1950)   In what year did World War II end?: Incorrect   Who is the current president of the [place]?: Correct (Biden)   Count from 20 down to 1?: Correct   What address did I give you earlier?: Correct ([address])   Total Score: 9   Interpretation of Total Score: Greater than 6 Normal   Fall Risk Screen   Do you feel unsteady when standing or walking?: No  Do you worry about falling?: No  Have you fallen in the past year?: Yes  How many times have you fallen?: 2 or more times  Were you ever injured from falling?: No   Depression Screen     Little interest or pleasure in doing things.: Not at all  Feeling down, depressed, or hopeless: Not at all  PHQ 2 Total: 0     Pain Score   Pain Score:   0 - No pain    Substance Use-Abuse Screening     Tobacco Use     In Past 12 MONTHS, how often have you used any tobacco product (for example, cigarettes, e-cigarettes, cigars, pipes, or smokeless tobacco)?: Never     Alcohol use     In the PAST 12 MONTHS, how often have you had 5 (men)/4 (women) or more drinks containing alcohol in one day?: Never     Prescription Drug Use     In the PAST 12 months, how often have you used any prescription medications just for the feeling, more than prescribed, or that were not prescribed for you? Prescriptions may include: opioids, benzodiazepines, medications for ADHD: Never            Illicit Drug Use   In the PAST 12 MONTHS, how often have you used any drugs, including marijuana, cocaine or crack, heroin, methamphetamine, hallucinogens, ecstasy/MDMA?: Never        Hearing Screen   Have you noticed any hearing difficulties?: Yes (has new hearing aids)  After whispering 9-1-6 how many numbers did the patient repeat correctly?: 3  After whispering 4-7-8 how many numbers did the patient repeat correctly?: 3       Vision Screen             Urine Incontinence Screen   Urinary Incontinence Screen  Do you ever leak urine when you don't want to?: No                     OBJECTIVE:   BP 137/89 (Site: Left Arm, Patient Position: Sitting, Cuff Size: Adult)   Pulse 71   Temp 36.9 C (98.4 F) (Temporal)  Resp 18   Ht 1.556 m (5' 1.25")   Wt 78.9 kg (174 lb)   SpO2 97%   BMI 32.61 kg/m        Other appropriate exam:    Health Maintenance Due   Topic Date Due    Osteoporosis screening  Never done    Hepatitis C screening  Never done    Adult Tdap-Td (1 - Tdap) Never done    Pneumococcal Vaccination, Age 79+ (1 of 1 - PCV) Never done    Covid-19 Vaccine (4 - 2023-24 season) 10/23/2021    Medicare Annual Wellness Visit - Calendar Year Insurers  Never done      ASSESSMENT & PLAN:  Problem List Items Addressed This Visit          Endocrine    Prediabetes    Hypothyroidism - Primary        Identified Risk Factors/ Recommended Actions     Fall Risk Follow up plan of care: Discussed optimizing home safety  The PHQ 2 Total: 0 depression screen is interpreted as negative.        Advanced Directives: Patient agreeable to - Advanced Directives discussed and patient elected to take home to review.        No orders of the defined types were placed in this encounter.         The patient has been educated about risk factors and recommended preventive care. Written Prevention Plan completed/ updated and given to patient (see After Visit Summary).    Return in about 6 months (around 03/25/2023).    Magdalene Patricia, MD

## 2022-09-22 NOTE — Patient Instructions (Addendum)
PREVNAR 20  TDAP

## 2022-09-22 NOTE — Progress Notes (Signed)
FAMILY MEDICINE, MEDICAL OFFICE BUILDING  [address]  [place] [address]      Whitney Vang  August 26, 1950  [id number]    Date of Service: 09/22/2022 10:00 AM EDT    Chief complaint:   Chief Complaint   Patient presents with    New Patient       Subjective:     This is a case of a 72 y.o. year old female who comes in today to establish care.     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  she checks her BS occ. And it was 101 at the end of the day.     LDL 131  HDL 46 TG 105  TSH 2.09    Neurosurgery consult ([facility]) at [facility]:  Cervical stenosis- planned for surgery if shoulder sxs cont. Which they did not.     Pt did not f/u in January as felt is was not necessary.      She completed PT for several months and this resolved the problem.     She is playing pickle ball and dances and goes to exercise classes a couple of times per week.  She does not experience SOB or CP w/ exertion.     MRI Lt shoulder:  IMPRESSION:  1. No rotator cuff tear.  2. 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  3. Suggestion of long head biceps tendinitis.      Recent lab ordered by Dr. Mean- labs said to be ok.     [facility]amm[facility]- Heterogeneously dense BIRADS 2    She had these additional testing because of a discomfort in her left breast.     Breast US:  Probably benign sonography with bilateral multiple nodules, more than left.  Consider breast MR for further evaluation, if clinically warranted.  Category 0: Final Overall Assessment: Additional imaging.  Mammo:  No definite dominant mass or abnormal accumulation of microcalcifications.   Suggest obtaining breast sonography for further evaluation.  Category 0: Final Overall Assessment: Additional Imaging.  Breast MRI 05/23:  IMPRESSION:  No definite abnormal enhancement or MR evidence of angiogenesis in the right or left breasts.   No MR evidence of malignancy.  Suggest continued annual  screening mammography.  Final overall assessment: BI-RADS 2: Benign Findings.      ROS:  Constitutional - no appetite or weight changes, no fatigue, no fevers, chills, or night sweats  Respiratory - no dyspnea or cough  Cardiovascular - no chest pain or palpitations  Gastrointestinal - no nausea, vomiting, diarrhea, or constipation, no dyspepsia  Endocrine - no heat or cold intolerance, no polyuria, polydipsia, or polyphagia  Skin - no rashes, color changes, or lesions  Musculoskeletal - no arthralgias or myalgias  Genitourinary - no urinary frequency or dysuria, no genital discharge  Neurologic - no vision or hearing changes, no weakness, no parasthesias   Psychiatric - mood has been appropriate, no depression or anxiety    Active Ambulatory Problems     Diagnosis Date Noted    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    FH: breast cancer in first degree relative 09/22/2022    Prediabetes 09/22/2022    Hypothyroidism 09/22/2022     Resolved Ambulatory Problems     Diagnosis Date Noted    No Resolved Ambulatory Problems     Past Medical History:   Diagnosis Date    Hyperlipidemia  Spinal stenosis        Past Surgical History Pertinent Negatives:   Procedure Date Noted    HX BREAST BIOPSY 07/09/2021    HX BREAST LUMPECTOMY 07/09/2021    HX BREAST RECONSTRUCTION 07/09/2021    HX BREAST REDUCTION 07/09/2021    HX CYST INCISION AND DRAINAGE 07/09/2021    HX HYSTERECTOMY 09/22/2022    HX MASTECTOMY, SIMPLE 07/09/2021    HX OOPHORECTOMY 07/09/2021           Current Outpatient Medications   Medication Sig    acetaminophen (TYLENOL) 500 mg Oral Tablet Take 1 Tablet (500 mg total) by mouth Twice per day as needed for Pain (Patient not taking: Reported on 09/22/2022)    atorvastatin (LIPITOR) 20 mg Oral Tablet Take 1 Tablet (20 mg total) by mouth Once a day    levothyroxine (SYNTHROID) 50 mcg Oral Tablet Take 1 Tablet (50 mcg total) by mouth Every morning    metFORMIN (GLUCOPHAGE XR) 500 mg Oral Tablet Sustained Release 24 hr  Take 1 Tablet (500 mg total) by mouth Once a day    multivit-min-iron-FA-vit K-lut (CENTRUM SILVER WOMEN) 8 mg iron-400 mcg-50 mcg Oral Tablet Take 1 Tablet by mouth Once a day       Objective:     BP 137/89 (Site: Left Arm, Patient Position: Sitting, Cuff Size: Adult)   Pulse 67   Temp 36.9 C (98.4 F) (Temporal)   Resp 18   Ht 1.556 m (5' 1.25")   Wt 78.9 kg (174 lb)   SpO2 97%   BMI 32.61 kg/m     She is up to date with dental and eye exam    130/85    BMI addressed: Advised on diet, weight loss, and exercise to reduce above normal BMI.     General appearance: alert, cooperative, in no acute distress  HEENT:  no LAD or thyromegaly, neck supple.  Lungs: clear to auscultation bilaterally; no wheezes or rhonchi   Heart: regular rate and rhythm; normal S1 & S2; no murmur  Abdomen: soft, non-tender, not distended; bowel sounds present.  No palpable masses.  Extremities: extremities normal ROM, no cyanosis or edema , no rash  Vascular: carotid, femoral, pedel pulses are normal w/o bruit  Psych: alert and oriented x 3  Neuro: CN 2-12 grossly intact  Peripheral motor and sensory exams are grossly normal    Assessment/Plan         ICD-10-CM    1. Cervical stenosis of spine  M48.02    No sxs at present   2. Hypothyroidism, unspecified type  E03.9    TSH 2.09   3. FH: breast cancer in first degree relative  Z80.3    Discussed option of repeating MRI at intervals given increased breast density and FH but she is not inclined at this time.    4. Prediabetes  A1C 6.1% 05/24 R73.03    Cont. Metformin 500 mg daily   5. Osteopenia, unspecified location  M85.80 DEXA BONE DENSITOMETRY           6. Hyperlipidemia, unspecified hyperlipidemia type  LDL 131/ HDL 46 E78.5 atorvastatin (LIPITOR) 20 mg Oral Tablet   !0 year MACE risk is elevated >12%.  Pt is agreeable to begin statin tx.  Discussed potential adverse rxtns such as muscle aches & advised to DC med if significant & notify me.   LIPID PANEL     COMPREHENSIVE METABOLIC  PNL, FASTING      7. Vitamin D deficiency  E55.9 VITAMIN D 25 TOTAL      8. Encounter for hepatitis C screening test for low risk patient  Z11.59 HEPATITIS C ANTIBODY SCREEN WITH REFLEX TO HCV PCR          Orders Placed This Encounter    DEXA BONE DENSITOMETRY    VITAMIN D 25 TOTAL    LIPID PANEL    COMPREHENSIVE METABOLIC PNL, FASTING    HEPATITIS C ANTIBODY SCREEN WITH REFLEX TO HCV PCR    multivit-min-iron-FA-vit K-lut (CENTRUM SILVER WOMEN) 8 mg iron-400 mcg-50 mcg Oral Tablet    atorvastatin (LIPITOR) 20 mg Oral Tablet       Health Maintenance:     Colon 11/23 due to positive Cologuard- negative for 10 yr f/u    [facility]    Update vaccinations Prevnar 20 and Tdap at her pharmacy          The patient was given the opportunity to ask questions and those questions were answered to the patient's satisfaction. The patient was encouraged to call with any additional questions or concerns. Instructed patient to call back if symptoms worse.   Discussed with patient effects and side effects of medications. Medication safety was discussed. A copy of the patient's medication list was printed and given to the patient. A good faith effort was made to reconcile the patient's medications.       Follow up: Return in about 14 weeks (around 12/29/2022).    Magdalene Patricia, MD

## 2022-09-22 NOTE — Nursing Note (Signed)
09/22/22 1000   Recent Weight Change   Have you had a recent unexplained weight loss or gain? N   Health Education and Literacy   How often do you have a problem understanding what is told to you about your medical condition?  Never   Domestic Violence   Because we are aware of abuse and domestic violence today, we ask all patients: Are you being hurt, hit, or frightened by anyone at your home or in your life?  N   Basic Needs   Do you have any basic needs within your home that are not being met? (such as Food, Shelter, [employer] fast streamer, Tranportation, paying for bills and/or medications) N   Advanced Directives   Do you have any advanced directives? No Advance   Would you like an advanced directive packet? Accepted Packet

## 2022-09-22 NOTE — Nursing Note (Signed)
09/22/22 1008   Advance Directives   Does patient have a living will or MPOA No   Advance directive information given to the patient today? Yes   Activities of Daily Living   Do you need help with dressing, bathing, or walking? No   Do you need help with shopping, housekeeping, medications, or finances? No   Do you have rugs in hallways, broken steps, or poor lighting? No   Do you have grab bars in your bathroom, non-slip strips in your tub, and hand rails on your stairs? Yes  (hand rails on stairs on porch no steps in house)   Cognitive Function Screen   What is you age? 1  (72)   What is the time to the nearest hour? 1  (10:00)   What is the year? 1  (2024)   What is the name of this clinic? 1  ([facility])   Can the patient recognize two persons (the doctor, the nurse, home help, etc.)? 1   What is the date of your birth? (day and month sufficient)  1  (1950-08-25)   In what year did World War II end? 0   Who is the current president of the [place]? 1  (Biden)   Count from 20 down to 1? 1   What address did I give you earlier? 1  ([address]   Total Score 9   Interpretation of Total Score Greater than 6 Normal   Depression Screen   Little interest or pleasure in doing things. 0   Feeling down, depressed, or hopeless 0   PHQ 2 Total 0   Pain Score   Pain Score Zero   Substance Use Screening   In Past 12 MONTHS, how often have you used any tobacco product (for example, cigarettes, e-cigarettes, cigars, pipes, or smokeless tobacco)? Never   In the PAST 12 MONTHS, how often have you had 5 (men)/4 (women) or more drinks containing alcohol in one day? Never   In the PAST 12 months, how often have you used any prescription medications just for the feeling, more than prescribed, or that were not prescribed for you? Prescriptions may include: opioids, benzodiazepines, medications for ADHD Never   In the PAST 12 MONTHS, how often have you used any drugs, including marijuana, cocaine or crack,  heroin, methamphetamine, hallucinogens, ecstasy/MDMA? Never   Hearing Screen   Have you noticed any hearing difficulties? Yes  (has new hearing aids)   After whispering 9-1-6 how many numbers did the patient repeat correctly? 3   After whispering 4-7-8 how many numbers did the patient repeat correctly? 3   Total Correct 6   Fall Risk Assessment   Do you feel unsteady when standing or walking? No   Do you worry about falling? No   Have you fallen in the past year? Yes   How many times have you fallen? 2 or more times   Were you ever injured from falling? No   Urinary Incontinence Screen   Do you ever leak urine when you don't want to? No

## 2022-09-22 NOTE — Nursing Note (Signed)
09/22/22 1001   Depression Screen   Little interest or pleasure in doing things. 0   Feeling down, depressed, or hopeless 0   PHQ 2 Total 0

## 2022-09-23 ENCOUNTER — Other Ambulatory Visit (INDEPENDENT_AMBULATORY_CARE_PROVIDER_SITE_OTHER): Payer: Self-pay

## 2022-10-06 ENCOUNTER — Ambulatory Visit (HOSPITAL_COMMUNITY): Payer: Self-pay

## 2022-11-30 ENCOUNTER — Inpatient Hospital Stay
Admission: RE | Admit: 2022-11-30 | Discharge: 2022-11-30 | Disposition: A | Discharge status: Home / Self Care | Payer: Medicare Other | Source: Ambulatory Visit | Attending: Internal Medicine | Admitting: Internal Medicine

## 2022-11-30 ENCOUNTER — Other Ambulatory Visit: Payer: Self-pay

## 2022-11-30 DIAGNOSIS — Z13820 Encounter for screening for osteoporosis: Secondary | ICD-10-CM | POA: Insufficient documentation

## 2022-11-30 DIAGNOSIS — M8580 Other specified disorders of bone density and structure, unspecified site: Secondary | ICD-10-CM | POA: Insufficient documentation

## 2022-11-30 DIAGNOSIS — M8588 Other specified disorders of bone density and structure, other site: Secondary | ICD-10-CM | POA: Insufficient documentation

## 2022-12-01 ENCOUNTER — Encounter (INDEPENDENT_AMBULATORY_CARE_PROVIDER_SITE_OTHER): Payer: Self-pay | Admitting: Internal Medicine

## 2022-12-01 DIAGNOSIS — M85859 Other specified disorders of bone density and structure, unspecified thigh: Secondary | ICD-10-CM | POA: Insufficient documentation

## 2022-12-22 ENCOUNTER — Ambulatory Visit (INDEPENDENT_AMBULATORY_CARE_PROVIDER_SITE_OTHER): Payer: Medicare Other

## 2022-12-22 ENCOUNTER — Other Ambulatory Visit: Payer: Self-pay

## 2022-12-22 ENCOUNTER — Other Ambulatory Visit: Payer: Medicare Other | Attending: Internal Medicine | Admitting: Internal Medicine

## 2022-12-22 DIAGNOSIS — E785 Hyperlipidemia, unspecified: Secondary | ICD-10-CM

## 2022-12-22 DIAGNOSIS — E559 Vitamin D deficiency, unspecified: Secondary | ICD-10-CM | POA: Insufficient documentation

## 2022-12-22 DIAGNOSIS — Z1159 Encounter for screening for other viral diseases: Secondary | ICD-10-CM | POA: Insufficient documentation

## 2022-12-22 LAB — COMPREHENSIVE METABOLIC PNL, FASTING
ALBUMIN/GLOBULIN RATIO: 1.2 (ref 0.8–1.4)
ALBUMIN: 4.3 g/dL (ref 3.5–5.7)
ALKALINE PHOSPHATASE: 81 U/L (ref 34–104)
ALT (SGPT): 16 U/L (ref 7–52)
ANION GAP: 4 mmol/L (ref 4–13)
AST (SGOT): 22 U/L (ref 13–39)
BILIRUBIN TOTAL: 0.4 mg/dL (ref 0.3–1.0)
BUN/CREA RATIO: 20 (ref 6–22)
BUN: 17 mg/dL (ref 7–25)
CALCIUM, CORRECTED: 9.7 mg/dL (ref 8.9–10.8)
CALCIUM: 9.9 mg/dL (ref 8.6–10.3)
CHLORIDE: 106 mmol/L (ref 98–107)
CO2 TOTAL: 28 mmol/L (ref 21–31)
CREATININE: 0.85 mg/dL (ref 0.60–1.30)
ESTIMATED GFR: 73 mL/min/{1.73_m2} (ref >59)
GLOBULIN: 3.5 (ref 2.0–3.5)
GLUCOSE: 108 mg/dL (ref 74–109)
OSMOLALITY, CALCULATED: 278 mosm/kg (ref 270–290)
POTASSIUM: 4.3 mmol/L (ref 3.5–5.1)
PROTEIN TOTAL: 7.8 g/dL (ref 6.4–8.9)
SODIUM: 138 mmol/L (ref 136–145)

## 2022-12-22 LAB — LIPID PANEL
CHOL/HDL RATIO: 2.7
CHOLESTEROL: 112 mg/dL (ref <200)
HDL CHOL: 42 mg/dL (ref >=40)
LDL CALC: 55 mg/dL (ref 0–100)
TRIGLYCERIDES: 76 mg/dL (ref <=150)
VLDL CALC: 15 mg/dL (ref 0–50)

## 2022-12-22 LAB — VITAMIN D 25 TOTAL: VITAMIN D 25, TOTAL: 26.38 ng/mL — ABNORMAL LOW (ref 30.00–100.00)

## 2022-12-23 ENCOUNTER — Encounter (INDEPENDENT_AMBULATORY_CARE_PROVIDER_SITE_OTHER): Payer: Self-pay | Admitting: Internal Medicine

## 2022-12-23 LAB — HEPATITIS C ANTIBODY SCREEN WITH REFLEX TO HCV PCR: HCV ANTIBODY QUALITATIVE: NEGATIVE

## 2022-12-29 ENCOUNTER — Encounter (INDEPENDENT_AMBULATORY_CARE_PROVIDER_SITE_OTHER): Payer: Self-pay | Admitting: Internal Medicine

## 2022-12-29 ENCOUNTER — Other Ambulatory Visit: Payer: Self-pay

## 2022-12-29 ENCOUNTER — Ambulatory Visit (INDEPENDENT_AMBULATORY_CARE_PROVIDER_SITE_OTHER): Payer: Medicare Other | Admitting: Internal Medicine

## 2022-12-29 VITALS — BP 132/83 | HR 73 | Temp 97.8°F | Resp 18 | Ht 61.0 in | Wt 175.0 lb

## 2022-12-29 DIAGNOSIS — M85859 Other specified disorders of bone density and structure, unspecified thigh: Secondary | ICD-10-CM

## 2022-12-29 DIAGNOSIS — R7303 Prediabetes: Secondary | ICD-10-CM

## 2022-12-29 DIAGNOSIS — R1310 Dysphagia, unspecified: Secondary | ICD-10-CM

## 2022-12-29 DIAGNOSIS — E039 Hypothyroidism, unspecified: Secondary | ICD-10-CM

## 2022-12-29 DIAGNOSIS — E785 Hyperlipidemia, unspecified: Secondary | ICD-10-CM

## 2022-12-29 MED ORDER — OMEPRAZOLE 40 MG CAPSULE,DELAYED RELEASE
40.0000 mg | DELAYED_RELEASE_CAPSULE | Freq: Every day | ORAL | 1 refills | Status: DC
Start: 2022-12-29 — End: 2023-03-28

## 2022-12-29 NOTE — Nursing Note (Signed)
12/29/22 1610   Domestic Violence   Because we are aware of abuse and domestic violence today, we ask all patients: Are you being hurt, hit, or frightened by anyone at your home or in your life?  N   Basic Needs   Do you have any basic needs within your home that are not being met? (such as Food, Shelter, Civil Service fast streamer, Tranportation, paying for bills and/or medications) N

## 2022-12-29 NOTE — Assessment & Plan Note (Signed)
Neurosurgery consult Raynald Kemp) at [facility]:  Cervical stenosis- planned for surgery if shoulder sxs cont. Which they did not.      Pt did not f/u in January as felt is was not necessary.       She completed PT for several months and this resolved the problem.

## 2022-12-29 NOTE — Assessment & Plan Note (Addendum)
Metformin prescribed by [facility]

## 2022-12-29 NOTE — Assessment & Plan Note (Addendum)
Synthroid prescribed by [facility]

## 2022-12-29 NOTE — Progress Notes (Signed)
FAMILY MEDICINE, MEDICAL OFFICE BUILDING  [address]  [place] [address]      LYNIX BONINE  17-Sep-1950  [id number]    Date of Service: 12/29/2022  8:45 AM EST    Chief complaint:   Chief Complaint   Patient presents with    Follow Up     Patient reports that she has been having the feeling like food is not going completely down, for the past 3 weeks.        Subjective:     This is a case of a 72 y.o. year old female who comes in today for CDM.    She is taking Atorvastatin with out any problems.    She notes that she issues with coughing in 2023 and had some x-rays and that is how they discovered her neck. She took Prilosec at that time which seemed to be helpful.     Currently she notes very occasional swallowing issues with bread type food.  She has not had to regurgitate.     Recent lab results reviewed and noted.  LIPID PROFILE  Lab Results   Component Value Date    CHOLESTEROL 112 12/22/2022    HDLCHOL 42 12/22/2022    LDLCHOL 55 12/22/2022    TRIG 76 12/22/2022       VITAMIN D 25-HYDROXY  No results found for: "VITD25", "VITD"    ROS:  Constitutional - no appetite or weight changes, no fatigue, no fevers, chills, or night sweats  Respiratory - no dyspnea or cough  Cardiovascular - no chest pain or palpitations  Gastrointestinal - no nausea, vomiting, diarrhea, or constipation, no dyspepsia  Endocrine - no heat or cold intolerance, no polyuria, polydipsia, or polyphagia  Skin - no rashes, color changes, or lesions  Musculoskeletal - no arthralgias or myalgias  Genitourinary - no urinary frequency or dysuria, no genital discharge  Neurologic - no vision or hearing changes, no weakness, no parasthesias   Psychiatric - mood has been appropriate, no depression or anxiety    Patient Active Problem List    Diagnosis Date Noted    Hyperlipidemia 12/29/2022    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    FH: breast cancer in first degree relative 09/22/2022     Prediabetes 09/22/2022    Hypothyroidism 09/22/2022       Past Surgical History:   Procedure Laterality Date    HX WISDOM TEETH EXTRACTION             Current Outpatient Medications   Medication Sig    atorvastatin (LIPITOR) 20 mg Oral Tablet Take 1 Tablet (20 mg total) by mouth Once a day    levothyroxine (SYNTHROID) 50 mcg Oral Tablet Take 1 Tablet (50 mcg total) by mouth Every morning    metFORMIN (GLUCOPHAGE XR) 500 mg Oral Tablet Sustained Release 24 hr Take 1 Tablet (500 mg total) by mouth Once a day    multivit-min-iron-FA-vit K-lut (CENTRUM SILVER WOMEN) 8 mg iron-400 mcg-50 mcg Oral Tablet Take 1 Tablet by mouth Once a day    omeprazole (PRILOSEC) 40 mg Oral Capsule, Delayed Release(E.C.) Take 1 Capsule (40 mg total) by mouth Once a day       Objective:     BP 132/83   Pulse 73   Temp 36.6 C (97.8 F) (Temporal)   Resp 18   Ht 1.549 m (5\' 1" )   Wt 79.4 kg (175 lb)   SpO2  97%   BMI 33.07 kg/m       BMI addressed: Advised on diet, weight loss, and exercise to reduce above normal BMI.     General appearance: alert, cooperative, in no acute distress  Lungs: clear to auscultation bilaterally; no wheezes or rhonchi   Heart: regular rate and rhythm; normal S1 & S2; no murmur  Extremities: extremities normal ROM, no cyanosis or edema , no rash  Psych: alert and oriented x 3  Neuro: CN 2-12 grossly intact  Peripheral motor and sensory exams are grossly normal    Assessment/Plan     Assessment & Plan  Prediabetes  Metformin prescribed by Meanna       Hypothyroidism  Synthroid prescribed by Meanna       Osteopenia of neck of femur  OPN FNR only at -1.2 & -1.6  10/24 Vit D 26  Advise OTC vit D 15,000 units weekly, 1200 mg calcium and wt bearing exercises  F/U DEXA 2 yrs         Hyperlipidemia  LDL 55 much improved on Atorvastatin 20 mg daily       Dysphagia, unspecified type  Take daily for one month.  Antireflux measures discussed.  May take prn afterwards.  F/U if sxs persist   Orders:    omeprazole  (PRILOSEC) 40 mg Oral Capsule, Delayed Release(E.C.); Take 1 Capsule (40 mg total) by mouth Once a day               Health Maintenance:     She got a Pneumonia shot at the pharmacy.  Prevnar 15 so is a candidate for Pneumovax next August.               The patient was given the opportunity to ask questions and those questions were answered to the patient's satisfaction. The patient was encouraged to call with any additional questions or concerns. Instructed patient to call back if symptoms worse.   Discussed with patient effects and side effects of medications. Medication safety was discussed. A copy of the patient's medication list was printed and given to the patient. A good faith effort was made to reconcile the patient's medications.       Follow up: Return in about 6 months (around 06/28/2023).    Magdalene Patricia, MD

## 2022-12-29 NOTE — Assessment & Plan Note (Addendum)
LDL 55 much improved on Atorvastatin 20 mg daily

## 2022-12-29 NOTE — Assessment & Plan Note (Signed)
04/24 Mammo CRoV WNL

## 2022-12-29 NOTE — Assessment & Plan Note (Addendum)
OPN FNR only at -1.2 & -1.6  10/24 Vit D 26  Advise OTC vit D 15,000 units weekly, 1200 mg calcium and wt bearing exercises  F/U DEXA 2 yrs

## 2023-03-28 ENCOUNTER — Ambulatory Visit (INDEPENDENT_AMBULATORY_CARE_PROVIDER_SITE_OTHER): Payer: Medicare Other | Admitting: Internal Medicine

## 2023-03-28 ENCOUNTER — Encounter (INDEPENDENT_AMBULATORY_CARE_PROVIDER_SITE_OTHER): Payer: Self-pay | Admitting: Internal Medicine

## 2023-03-28 ENCOUNTER — Other Ambulatory Visit: Payer: Self-pay

## 2023-03-28 VITALS — BP 138/78 | HR 74 | Temp 97.9°F | Resp 18 | Ht 64.0 in | Wt 174.0 lb

## 2023-03-28 VITALS — BP 138/78 | HR 74 | Temp 97.9°F | Resp 18 | Ht 61.0 in | Wt 174.0 lb

## 2023-03-28 DIAGNOSIS — E119 Type 2 diabetes mellitus without complications: Secondary | ICD-10-CM

## 2023-03-28 DIAGNOSIS — E039 Hypothyroidism, unspecified: Secondary | ICD-10-CM

## 2023-03-28 DIAGNOSIS — E785 Hyperlipidemia, unspecified: Secondary | ICD-10-CM

## 2023-03-28 DIAGNOSIS — Z1239 Encounter for other screening for malignant neoplasm of breast: Secondary | ICD-10-CM

## 2023-03-28 DIAGNOSIS — M85859 Other specified disorders of bone density and structure, unspecified thigh: Secondary | ICD-10-CM

## 2023-03-28 DIAGNOSIS — Z0000 Encounter for general adult medical examination without abnormal findings: Secondary | ICD-10-CM

## 2023-03-28 DIAGNOSIS — Z23 Encounter for immunization: Secondary | ICD-10-CM

## 2023-03-28 DIAGNOSIS — K5900 Constipation, unspecified: Secondary | ICD-10-CM

## 2023-03-28 DIAGNOSIS — Z7984 Long term (current) use of oral hypoglycemic drugs: Secondary | ICD-10-CM

## 2023-03-28 NOTE — Nursing Note (Signed)
03/28/23 0917   Comprehensive Health Assessment-Adult   Do you wish to complete this form? Yes   During the past 4 weeks, how would you rate your health in general? Excellent   During the past 4 weeks, how much difficulty have you had doing your usual activities inside and outside your home because of medical or emotional problems? No difficulty at all   During the past 4 weeks, was someone available to help you if you needed and wanted help? Yes, as much as I wanted   In the past year, how many times have you gone to the emergency department or been admitted to a hospital for a health problem? None   Are you generally satisfied with your sleep? Yes   Do you have enough money to buy things you need in everyday life, such as food, clothing, medicines, and housing? Yes, always   Can you get to places beyond walking distance without help?  (For example, can you drive your own car or travel alone on buses)? Yes   Do you fasten your seatbelt when you are in a car? Yes, usually   Do you exercise 20 minutes 3 or more days per week (such as walking, dancing, biking, mowing grass, swimming)? Yes, most of the time   How often do you eat food that is healthy (fruits, vegetables, lean meats) instead of unhealthy (sweets, fast food, junk food, fatty foods)? Some of the time   Have your parents, brothers or sisters had any of the following problems before the age of 34? (check all that apply) Cancer;Diabetes (sugar);Heart problems, or hardening of the arteries;High cholesterol   How often do you have trouble taking medicines the eay you are told to take them? I always take them as prescribed   Do you need any help communicating with your doctors and nurses because of vision or hearing problems? No   During the past 12 months, have you experienced confusion or memory loss that is happening more often or is getting worse? No   Do you have one person you think of as your personal doctor (primary care provider or family doctor)?  Yes   If you are seeing a Primary Care Provider (PCP) or family doctor. please list their name Dr.Marshall   Are you now also seeing any specialist physician(s) (such as eye doctor, foot doctor, skin doctor)? Yes   If you are seeing a specialist for anything such as foot, eye, skin, etc.  please list their name(s) Dr.mena, Dr.Lane- vision, Dr.Martin dental, Hear again   How confident are you that you can control or manage most of your health problems? Very confident

## 2023-03-28 NOTE — Assessment & Plan Note (Signed)
Synthroid 50mcg daily 

## 2023-03-28 NOTE — Progress Notes (Signed)
FAMILY MEDICINE, MEDICAL OFFICE BUILDING  [address]  [place] [address]    Medicare Annual Wellness Visit    Name: MONICK RENA MRN:  [id number]   Date: 03/28/2023 Age: 73 y.o.       SUBJECTIVE:   ELLEN GORIS is a 73 y.o. female for presenting for Medicare Wellness exam.   I have reviewed and reconciled the medication list with the patient today.        03/28/2023     9:17 AM 09/22/2022    10:05 AM   Comprehensive Health Assessment-Adult   Do you wish to complete this form? Yes Yes   During the past 4 weeks, how would you rate your health in general? Excellent Excellent   During the past 4 weeks, how much difficulty have you had doing your usual activities inside and outside your home because of medical or emotional problems? No difficulty at all No difficulty at all   During the past 4 weeks, was someone available to help you if you needed and wanted help? Yes, as much as I wanted Yes, as much as I wanted   In the past year, how many times have you gone to the emergency department or been admitted to a hospital for a health problem? None None   Are you generally satisfied with your sleep? Yes Yes   Do you have enough money to buy things you need in everyday life, such as food, clothing, medicines, and housing? Yes, always Yes, always   Can you get to places beyond walking distance without help?  (For example, can you drive your own car or travel alone on buses)? Yes Yes   Do you fasten your seatbelt when you are in a car? Yes, usually Yes, usually   Do you exercise 20 minutes 3 or more days per week (such as walking, dancing, biking, mowing grass, swimming)? Yes, most of the time Yes, most of the time   How often do you eat food that is healthy (fruits, vegetables, lean meats) instead of unhealthy (sweets, fast food, junk food, fatty foods)? Some of the time Almost always   Have your parents, brothers or sisters had any of the following problems before the age of 64? (check all that apply) Cancer;Diabetes  (sugar);Heart problems, or hardening of the arteries;High cholesterol Heart problems, or hardening of the arteries;Diabetes (sugar);Cancer;High cholesterol   How often do you have trouble taking medicines the eay you are told to take them? I always take them as prescribed I always take them as prescribed   Do you need any help communicating with your doctors and nurses because of vision or hearing problems? No No   During the past 12 months, have you experienced confusion or memory loss that is happening more often or is getting worse? No No   Do you have one person you think of as your personal doctor (primary care provider or family doctor)? Yes Yes   If you are seeing a Primary Care Provider (PCP) or family doctor. please list their name Dr.Ortencia Askari [facility]   Are you now also seeing any specialist physician(s) (such as eye doctor, foot doctor, skin doctor)? Yes Yes   If you are seeing a specialist for anything such as foot, eye, skin, etc.  please list their name(s) Dr.mena, Dr.Lane- vision, Dr.Martin dental, Hear again Dr. Michelle Nasuti, Dr. Daphine Deutscher, Eye doctor   How confident are you that you can control or manage most of your health problems? Very confident  Very confident       I have reviewed and updated as appropriate the past medical, family and social history. 03/28/2023 as summarized below:  Past Medical History:   Diagnosis Date    Hyperlipidemia     Hypothyroidism     Prediabetes     Spinal stenosis     [facility]     Past Surgical History:   Procedure Laterality Date    Hx wisdom teeth extraction       Current Outpatient Medications   Medication Sig    atorvastatin (LIPITOR) 20 mg Oral Tablet Take 1 Tablet (20 mg total) by mouth Once a day    levothyroxine (SYNTHROID) 50 mcg Oral Tablet Take 1 Tablet (50 mcg total) by mouth Every morning    metFORMIN (GLUCOPHAGE XR) 500 mg Oral Tablet Sustained Release 24 hr Take 1 Tablet (500 mg total) by mouth Once a day    multivit-min-iron-FA-vit K-lut (CENTRUM SILVER  WOMEN) 8 mg iron-400 mcg-50 mcg Oral Tablet Take 1 Tablet by mouth Once a day     Family Medical History:       Problem Relation (Age of Onset)    Bone cancer Mother    Breast Cancer Mother    Diabetes Brother    Diabetes type II Mother, Brother, Brother, Brother, Brother    Heart Disease Mother, Father, Brother, Brother, Brother, Brother    Hypertension (High Blood Pressure) Sister    Irregular heart beat Sister    No Known Problems Maternal Aunt, Maternal Uncle, Paternal Aunt, Paternal Uncle, Maternal Grandmother, Maternal Grandfather, Paternal Grandmother, Paternal Grandfather, Daughter, Son, Other    Pacemaker Brother            Social History     Socioeconomic History    Marital status: Divorced   Tobacco Use    Smoking status: Never    Smokeless tobacco: Never   Vaping Use    Vaping status: Never Used   Substance and Sexual Activity    Alcohol use: Not Currently    Drug use: Not Currently     Social Determinants of Health     Health Literacy: Low Risk  (09/22/2022)    Health Literacy     SDOH Health Literacy: Never         List of Current Health Care Providers   Care Team       PCP       Name Type Specialty Phone Number    Magdalene Patricia, MD Physician [facility] [phone]              Care Team       No care team found                      Health Maintenance   Topic Date Due    Diabetic Retinal Exam  Never done    Diabetic A1C  Never done    Diabetic Kidney Health Microalb/Cr Ratio  Never done    Adult Tdap-Td (1 - Tdap) Never done    Covid-19 Vaccine (4 - 2024-25 season) 10/24/2022    Medicare Annual Wellness Visit - Calendar Year Insurers  02/23/2023    Pneumococcal Vaccination, Age 55+ (1 of 2 - PCV) 09/23/2023 (Originally 12/10/1969)    Diabetic Kidney Health eGFR  12/22/2023    Depression Screening  03/27/2024    Breast Cancer Screening  06/20/2024    Osteoporosis screening  11/30/2027    Colonoscopy  12/24/2031  Hepatitis C screening  Completed    Influenza Vaccine  Completed    Shingles  Vaccine  Completed     Medicare Wellness Assessment   Medicare initial or wellness physical in the last year?: No  Advance Directives   Does patient have a living will or MPOA: Yes   Has patient provided [employer] with a copy?: No   Patient advised to bring in copy.: Yes          Activities of Daily Living   Do you need help with dressing, bathing, or walking?: No   Do you need help with shopping, housekeeping, medications, or finances?: No   Do you have rugs in hallways, broken steps, or poor lighting?: No   Do you have grab bars in your bathroom, non-slip strips in your tub, and hand rails on your stairs?: No   Cognitive Function Screen (1=Yes, 0=No)   What is you age?: Correct   What is the time to the nearest hour?: Correct   What is the year?: Correct   What is the name of this clinic?: Correct   Can the patient recognize two persons (the doctor, the nurse, home help, etc.)?: Correct   What is the date of your birth? (day and month sufficient) : Correct   In what year did World War II end?: Incorrect   Who is the current president of the [place]?: Correct   Count from 20 down to 1?: Correct   What address did I give you earlier?: Correct   Total Score: 9   Interpretation of Total Score: Greater than 6 Normal   Fall Risk Screen   Do you feel unsteady when standing or walking?: No  Do you worry about falling?: No  Have you fallen in the past year?: Yes  How many times have you fallen?: 2 or more times  Were you ever injured from falling?: No   Depression Screen     Little interest or pleasure in doing things.: Not at all  Feeling down, depressed, or hopeless: Not at all  PHQ 2 Total: 0     Pain Score   Pain Score:   0 - No pain    Substance Use-Abuse Screening     Tobacco Use     In Past 12 MONTHS, how often have you used any tobacco product (for example, cigarettes, e-cigarettes, cigars, pipes, or smokeless tobacco)?: Never     Alcohol use     In the PAST 12 MONTHS, how often have you had 5 (men)/4  (women) or more drinks containing alcohol in one day?: Never     Prescription Drug Use     In the PAST 12 months, how often have you used any prescription medications just for the feeling, more than prescribed, or that were not prescribed for you? Prescriptions may include: opioids, benzodiazepines, medications for ADHD: Never           Illicit Drug Use   In the PAST 12 MONTHS, how often have you used any drugs, including marijuana, cocaine or crack, heroin, methamphetamine, hallucinogens, ecstasy/MDMA?: Never            Urine Incontinence Screen   Urinary Incontinence Screen  Do you ever leak urine when you don't want to?: No                     OBJECTIVE:   BP 138/78 (Site: Right Arm, Patient Position: Sitting, Cuff Size: Adult)   Pulse 74  Temp 36.6 C (97.9 F) (Temporal)   Resp 18   Ht 1.626 m (5\' 4" )   Wt 78.9 kg (174 lb)   SpO2 98%   BMI 29.87 kg/m        Other appropriate exam:    Health Maintenance Due   Topic Date Due    Diabetic Retinal Exam  Never done    Diabetic A1C  Never done    Diabetic Kidney Health Microalb/Cr Ratio  Never done    Adult Tdap-Td (1 - Tdap) Never done    Covid-19 Vaccine (4 - 2024-25 season) 10/24/2022    Medicare Annual Wellness Visit - Calendar Year Insurers  02/23/2023      ASSESSMENT & PLAN:  Problem List Items Addressed This Visit          Endocrine    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        Advanced Directives: Patient agreeable to - Advanced Directives discussed and patient elected to take home to review.        No orders of the defined types were placed in this encounter.         The patient has been educated about risk factors and recommended preventive care. Written Prevention Plan completed/ updated and given to patient  (see After Visit Summary).    Return in about 1 year (around 03/27/2024).    Magdalene Patricia, MD

## 2023-03-28 NOTE — Assessment & Plan Note (Addendum)
A1C 6.1%  Metformin  Atorvastatin  LDL 55  GFR 73

## 2023-03-28 NOTE — Nursing Note (Signed)
03/28/23 1610   Domestic Violence   Because we are aware of abuse and domestic violence today, we ask all patients: Are you being hurt, hit, or frightened by anyone at your home or in your life?  N   Basic Needs   Do you have any basic needs within your home that are not being met? (such as Food, Shelter, [employer] fast streamer, Tranportation, paying for bills and/or medications) N

## 2023-03-28 NOTE — Nursing Note (Signed)
03/28/23 0919   Medicare Wellness Assessment   Medicare initial or wellness physical in the last year? No   Advance Directives   Does patient have a living will or MPOA Yes   Has patient provided [employer] with a copy? No   Patient advised to bring in copy. Yes   Activities of Daily Living   Do you need help with dressing, bathing, or walking? No   Do you need help with shopping, housekeeping, medications, or finances? No   Do you have rugs in hallways, broken steps, or poor lighting? No   Do you have grab bars in your bathroom, non-slip strips in your tub, and hand rails on your stairs? No   Cognitive Function Screen   What is you age? 1   What is the time to the nearest hour? 1   Remember this address: [address]   What is the year? 1   What is the name of this clinic? 1   Can the patient recognize two persons (the doctor, the nurse, home help, etc.)? 1   What is the date of your birth? (day and month sufficient)  1   In what year did World War II end? 0   Who is the current president of the [place]? 1   Count from 20 down to 1? 1   What address did I give you earlier? 1   Total Score 9   Interpretation of Total Score Greater than 6 Normal   Depression Screen   Little interest or pleasure in doing things. 0   Feeling down, depressed, or hopeless 0   PHQ 2 Total 0   Pain Score   Pain Score Zero   Substance Use Screening   In Past 12 MONTHS, how often have you used any tobacco product (for example, cigarettes, e-cigarettes, cigars, pipes, or smokeless tobacco)? Never   In the PAST 12 MONTHS, how often have you had 5 (men)/4 (women) or more drinks containing alcohol in one day? Never   In the PAST 12 months, how often have you used any prescription medications just for the feeling, more than prescribed, or that were not prescribed for you? Prescriptions may include: opioids, benzodiazepines, medications for ADHD Never   In the PAST 12 MONTHS, how often have you used any drugs,  including marijuana, cocaine or crack, heroin, methamphetamine, hallucinogens, ecstasy/MDMA? Never   Fall Risk Assessment   Do you feel unsteady when standing or walking? No   Do you worry about falling? No   Have you fallen in the past year? Yes   How many times have you fallen? 2 or more times   Were you ever injured from falling? No   Urinary Incontinence Screen   Do you ever leak urine when you don't want to? No

## 2023-03-28 NOTE — Assessment & Plan Note (Signed)
Vit D 26 taking MVI daily- suggest that she add vitamin D 4000 units daily  Ensure adequate calcium of 1200 mg daily

## 2023-03-28 NOTE — Progress Notes (Signed)
FAMILY MEDICINE, MEDICAL OFFICE BUILDING  [address]  [place] [address]      Whitney Vang  02/15/1951  [id number]    Date of Service: 03/28/2023  9:15 AM EST    Chief complaint:   Chief Complaint   Patient presents with    Follow Up 6 Months     Patient is here for CDM. Patient wishes to discuss her temperature. She states she has an appt with Dr.Mena next week but has heard he is retiring and wants to know if we can follow her for for diabetes and thyroid.        Subjective:     This is a case of a 73 y.o. year old female who comes in today for CDM.    Her last A1C was about 4 months ago and it was 6.1%    Her last eye exam was last year and she had no DR    Recent lab results reviewed and noted.  She has upcoming labs at [facility] for Dr. Darden Palmer and this will likely be last appt due to his retirement.     She notes that she may feel hot or cold. She is staying very active with her gym membership and book club.  She does not experience any SOB or chest discomfort when exercising.  She attends yoga and stretching classes.    She has struggled with lifelong constipation and she manages it with fiber.     She is not having any difficulties with her medications.      ROS:  Constitutional - no appetite or weight changes, no fatigue, no fevers, chills, or night sweats  Respiratory - no dyspnea or cough  Cardiovascular - no chest pain or palpitations  Gastrointestinal - no nausea, vomiting, diarrhea, or constipation, no dyspepsia  Endocrine - no heat or cold intolerance, no polyuria, polydipsia, or polyphagia  Skin - no rashes, color changes, or lesions  Musculoskeletal - no arthralgias or myalgias  Genitourinary - no urinary frequency or dysuria, no genital discharge  Neurologic - no vision or hearing changes, no weakness, no parasthesias   Psychiatric - mood has been appropriate, no depression or anxiety    Patient Active Problem List    Diagnosis Date Noted    Health care maintenance 03/28/2023    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) 03/28/2023    Hyperlipidemia 12/29/2022    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    FH: breast cancer in first degree relative 09/22/2022    Prediabetes 09/22/2022    Hypothyroidism 09/22/2022    Spinal stenosis 09/29/2021       Past Surgical History:   Procedure Laterality Date    HX WISDOM TEETH EXTRACTION             Current Outpatient Medications   Medication Sig    atorvastatin (LIPITOR) 20 mg Oral Tablet Take 1 Tablet (20 mg total) by mouth Once a day    levothyroxine (SYNTHROID) 50 mcg Oral Tablet Take 1 Tablet (50 mcg total) by mouth Every morning    metFORMIN (GLUCOPHAGE XR) 500 mg Oral Tablet Sustained Release 24 hr Take 1 Tablet (500 mg total) by mouth Once a day    multivit-min-iron-FA-vit K-lut (CENTRUM SILVER WOMEN) 8 mg iron-400 mcg-50 mcg Oral Tablet Take 1 Tablet by mouth Once a day       Objective:     BP 138/78 (Site: Right Arm,  Patient Position: Sitting, Cuff Size: Adult)   Pulse 74   Temp 36.6 C (97.9 F) (Temporal)   Resp 18   Ht 1.549 m (5\' 1" )   Wt 78.9 kg (174 lb)   SpO2 98%   BMI 32.88 kg/m        General appearance: alert, cooperative, in no acute distress  Lungs: clear to auscultation bilaterally; no wheezes or rhonchi   Heart: regular rate and rhythm; normal S1 & S2; no murmur  Extremities: extremities normal ROM, no cyanosis or edema , no rash  Psych: alert and oriented x 3  Neuro: CN 2-12 grossly intact  Peripheral motor and sensory exams are grossly normal    Assessment/Plan     Assessment & Plan  Encounter for screening for malignant neoplasm of breast, unspecified screening modality    Orders:    MAMMO BILATERAL SCREENING; Future    Need for immunization against influenza    Orders:    Flu Vaccine, 65+,0.5 mL IM (Admin)    Type 2 diabetes mellitus without complications (CMS HCC)  A1C 6.1%  Metformin  Atorvastatin  LDL 55  GFR 73       Constipation, unspecified constipation  type    Orders:    CBC/DIFF; Future    Hypothyroidism  Synthroid daily       Osteopenia of neck of femur  Vit D 26 taking MVI daily- suggest that she add vitamin D 4000 units daily  Ensure adequate calcium of 1200 mg daily                  Health Maintenance:                   The patient was given the opportunity to ask questions and those questions were answered to the patient's satisfaction. The patient was encouraged to call with any additional questions or concerns. Instructed patient to call back if symptoms worse.   Discussed with patient effects and side effects of medications. Medication safety was discussed. A copy of the patient's medication list was printed and given to the patient. A good faith effort was made to reconcile the patient's medications.       Follow up: Return in about 6 months (around 09/25/2023).    Magdalene Patricia, MD

## 2023-04-01 LAB — A1C (POINT OF CARE): POCT HGB A1C: 6.2 % — AB (ref 4–6)

## 2023-04-04 ENCOUNTER — Encounter (INDEPENDENT_AMBULATORY_CARE_PROVIDER_SITE_OTHER): Payer: Self-pay | Admitting: Internal Medicine

## 2023-04-07 ENCOUNTER — Ambulatory Visit (HOSPITAL_COMMUNITY): Payer: Self-pay

## 2023-05-17 ENCOUNTER — Ambulatory Visit (INDEPENDENT_AMBULATORY_CARE_PROVIDER_SITE_OTHER): Admitting: Internal Medicine

## 2023-05-17 ENCOUNTER — Encounter (INDEPENDENT_AMBULATORY_CARE_PROVIDER_SITE_OTHER): Payer: Self-pay | Admitting: Internal Medicine

## 2023-05-17 ENCOUNTER — Other Ambulatory Visit: Payer: Self-pay

## 2023-05-17 VITALS — BP 116/61 | HR 79 | Temp 97.7°F | Resp 16 | Ht 64.0 in | Wt 177.0 lb

## 2023-05-17 DIAGNOSIS — H9202 Otalgia, left ear: Secondary | ICD-10-CM

## 2023-05-17 DIAGNOSIS — R0982 Postnasal drip: Secondary | ICD-10-CM

## 2023-05-17 DIAGNOSIS — H6121 Impacted cerumen, right ear: Secondary | ICD-10-CM

## 2023-05-17 MED ORDER — FLUTICASONE PROPIONATE 50 MCG/ACTUATION NASAL SPRAY,SUSPENSION: 1.0000 | Freq: Every day | NASAL | 3 refills | Status: DC

## 2023-05-17 NOTE — Progress Notes (Signed)
 FAMILY MEDICINE, MEDICAL OFFICE BUILDING  [address]  [place] [address]      Whitney Vang  Nov 26, 1950  [id number]    Date of Service: 05/17/2023  2:00 PM EDT    Chief complaint:   Chief Complaint   Patient presents with    Ear Pain     Patient complains of bilateral ear pain that started last week.She states the pain comes and goes.  She denies fever.     Difficulty Swallowing     Patient complains of difficultly swallowing she states most of the time it is liquids. She reports a hx of thyroid nodule, she states that she had a recent ultrasound and has appt with Dr. Mean tomorrow.     Sinus Drainage     Patient reports sinus drainage usually when she first gets up in the mornings she states this has been going on for the past month.        Subjective:     This is a case of a 73 y.o. year old female who comes in today for CDM.    She had an US  of her thyroid at [facility].      She notes that her swallowing issue is overall better.  Esp w/ water. But this is generally after a piece of food sticks.  She has stopped pop.  She does not regurgitate food.  She does not have much in the way of reflux.  Post nasal drip mostly in the AM.  Weitzel suggested.     Recent lab results reviewed and noted.  A1C 6.1%    She did not have cervical spine surgery at [facility].  He told her she did not need surgery. PT took care of her left shoulder tx and she manages this with lifting wt.s      ROS:  Constitutional - no appetite or weight changes, no fatigue, no fevers, chills, or night sweats  Respiratory - no dyspnea or cough  Cardiovascular - no chest pain or palpitations  Gastrointestinal - no nausea, vomiting, diarrhea, or constipation, no dyspepsia  Endocrine - no heat or cold intolerance, no polyuria, polydipsia, or polyphagia  Skin - no rashes, color changes, or lesions  Musculoskeletal - no arthralgias or myalgias  Genitourinary - no urinary frequency or dysuria, no genital discharge  Neurologic - no vision or hearing changes,  no weakness, no parasthesias   Psychiatric - mood has been appropriate, no depression or anxiety    Patient Active Problem List    Diagnosis Date Noted    Health care maintenance 03/28/2023    Type 2 diabetes mellitus without complications (CMS HCC) 03/28/2023    Hyperlipidemia 12/29/2022    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    FH: breast cancer in first degree relative 09/22/2022    Prediabetes 09/22/2022    Hypothyroidism 09/22/2022    Spinal stenosis 09/29/2021       Past Surgical History:   Procedure Laterality Date    HX WISDOM TEETH EXTRACTION             Current Outpatient Medications   Medication Sig    atorvastatin  (LIPITOR) 20 mg Oral Tablet Take 1 Tablet (20 mg total) by mouth Once a day    cholecalciferol, vitamin D3, 25 mcg (1,000 unit) Oral Tablet Take 1 Tablet (1,000 Units total) by mouth Daily    levothyroxine (SYNTHROID) 50 mcg Oral Tablet Take 1 Tablet (50 mcg total)  by mouth Every morning    metFORMIN (GLUCOPHAGE XR) 500 mg Oral Tablet Sustained Release 24 hr Take 1 Tablet (500 mg total) by mouth Daily    multivit-min-iron -FA-vit K-lut (CENTRUM SILVER  WOMEN) 8 mg iron -400 mcg-50 mcg Oral Tablet Take 1 Tablet by mouth Once a day       Objective:     BP 116/61 (Site: Left Arm, Patient Position: Sitting, Cuff Size: Adult)   Pulse 79   Temp 36.5 C (97.7 F) (Temporal)   Resp 16   Ht 1.626 m (5\' 4" )   Wt 80.3 kg (177 lb)   SpO2 98%   BMI 30.38 kg/m      General appearance: alert, cooperative, in no acute distress  HEENT: PERRL; Ears: cerumen blocking Rt ext otic canal, Lft TM is sclerotic,malleus appear pink.  Throat: non-erythematous w/o lesion, moist mucous membranes; no LAD or thyromegaly, neck supple.  Lungs: clear to auscultation bilaterally; no wheezes or rhonchi   Heart: regular rate and rhythm; normal S1 & S2; no murmur  Extremities: extremities normal ROM, no cyanosis or edema , no rash  Psych: alert and oriented x 3  Neuro: CN 2-12  grossly intact  Peripheral motor and sensory exams are grossly normal    Assessment/Plan     Assessment & Plan  Post-nasal drip  Allegra 180mg  daily  Orders:    fluticasone  propionate (FLONASE ) 50 mcg/actuation Nasal Spray, Suspension; Administer 1 Spray into each nostril Daily    Ear pain, left  May use Afrin nasal spray up to 3 days  No indication for Abs at this time  Orders:    Referral to OTOLARYNGOLOGY - [place] - Sherline Distel, Marrian Siva; Future    Hearing loss due to cerumen impaction, right                    Health Maintenance:     She will call back if these measures are not helpful.               The patient was given the opportunity to ask questions and those questions were answered to the patient's satisfaction. The patient was encouraged to call with any additional questions or concerns. Instructed patient to call back if symptoms worse.   Discussed with patient effects and side effects of medications. Medication safety was discussed. A copy of the patient's medication list was printed and given to the patient. A good faith effort was made to reconcile the patient's medications.       Follow up: No follow-ups on file.    Rebbeca Campi, MD

## 2023-05-17 NOTE — Nursing Note (Signed)
 05/17/23 1358   Domestic Violence   Because we are aware of abuse and domestic violence today, we ask all patients: Are you being hurt, hit, or frightened by anyone at your home or in your life?  N   Basic Needs   Do you have any basic needs within your home that are not being met? (such as Food, Shelter, [employer] fast streamer, Tranportation, paying for bills and/or medications) N

## 2023-05-19 ENCOUNTER — Encounter (INDEPENDENT_AMBULATORY_CARE_PROVIDER_SITE_OTHER): Payer: Self-pay | Admitting: Internal Medicine

## 2023-05-19 DIAGNOSIS — E041 Nontoxic single thyroid nodule: Secondary | ICD-10-CM | POA: Insufficient documentation

## 2023-06-14 ENCOUNTER — Other Ambulatory Visit (INDEPENDENT_AMBULATORY_CARE_PROVIDER_SITE_OTHER): Payer: Self-pay

## 2023-06-17 ENCOUNTER — Encounter (INDEPENDENT_AMBULATORY_CARE_PROVIDER_SITE_OTHER): Payer: Self-pay | Admitting: Internal Medicine

## 2023-06-23 ENCOUNTER — Ambulatory Visit
Admission: RE | Admit: 2023-06-23 | Discharge: 2023-06-23 | Disposition: A | Discharge status: Home / Self Care | Payer: Self-pay | Source: Ambulatory Visit | Attending: Internal Medicine | Admitting: Internal Medicine

## 2023-06-23 ENCOUNTER — Other Ambulatory Visit: Payer: Self-pay

## 2023-06-23 ENCOUNTER — Encounter (HOSPITAL_COMMUNITY): Payer: Self-pay

## 2023-06-23 DIAGNOSIS — Z1239 Encounter for other screening for malignant neoplasm of breast: Secondary | ICD-10-CM

## 2023-06-23 DIAGNOSIS — Z1231 Encounter for screening mammogram for malignant neoplasm of breast: Secondary | ICD-10-CM | POA: Insufficient documentation

## 2023-06-28 ENCOUNTER — Ambulatory Visit (INDEPENDENT_AMBULATORY_CARE_PROVIDER_SITE_OTHER): Payer: Self-pay | Admitting: Internal Medicine

## 2023-06-28 ENCOUNTER — Encounter (INDEPENDENT_AMBULATORY_CARE_PROVIDER_SITE_OTHER): Payer: Self-pay | Admitting: Internal Medicine

## 2023-06-28 DIAGNOSIS — Z1239 Encounter for other screening for malignant neoplasm of breast: Secondary | ICD-10-CM

## 2023-06-30 ENCOUNTER — Emergency Department (HOSPITAL_COMMUNITY)

## 2023-06-30 ENCOUNTER — Encounter (INDEPENDENT_AMBULATORY_CARE_PROVIDER_SITE_OTHER): Payer: Self-pay | Admitting: OTOLARYNGOLOGY

## 2023-06-30 ENCOUNTER — Encounter (HOSPITAL_COMMUNITY): Payer: Self-pay

## 2023-06-30 ENCOUNTER — Other Ambulatory Visit: Payer: Self-pay

## 2023-06-30 ENCOUNTER — Emergency Department
Admission: EM | Admit: 2023-06-30 | Discharge: 2023-06-30 | Disposition: A | Discharge status: Home / Self Care | Attending: Physician Assistant | Admitting: Physician Assistant

## 2023-06-30 ENCOUNTER — Ambulatory Visit (INDEPENDENT_AMBULATORY_CARE_PROVIDER_SITE_OTHER): Payer: Self-pay | Admitting: OTOLARYNGOLOGY

## 2023-06-30 VITALS — Ht 64.0 in | Wt 177.0 lb

## 2023-06-30 DIAGNOSIS — H9190 Unspecified hearing loss, unspecified ear: Secondary | ICD-10-CM | POA: Insufficient documentation

## 2023-06-30 DIAGNOSIS — H6993 Unspecified Eustachian tube disorder, bilateral: Secondary | ICD-10-CM

## 2023-06-30 DIAGNOSIS — E041 Nontoxic single thyroid nodule: Secondary | ICD-10-CM | POA: Insufficient documentation

## 2023-06-30 DIAGNOSIS — K8020 Calculus of gallbladder without cholecystitis without obstruction: Secondary | ICD-10-CM | POA: Insufficient documentation

## 2023-06-30 DIAGNOSIS — K5900 Constipation, unspecified: Secondary | ICD-10-CM | POA: Insufficient documentation

## 2023-06-30 DIAGNOSIS — H9202 Otalgia, left ear: Secondary | ICD-10-CM

## 2023-06-30 DIAGNOSIS — R1011 Right upper quadrant pain: Secondary | ICD-10-CM

## 2023-06-30 DIAGNOSIS — R110 Nausea: Secondary | ICD-10-CM

## 2023-06-30 DIAGNOSIS — R1031 Right lower quadrant pain: Secondary | ICD-10-CM

## 2023-06-30 LAB — COMPREHENSIVE METABOLIC PANEL, NON-FASTING
ALBUMIN/GLOBULIN RATIO: 1.2 (ref 0.8–1.4)
ALBUMIN: 4.2 g/dL (ref 3.5–5.7)
ALKALINE PHOSPHATASE: 69 U/L (ref 34–104)
ALT (SGPT): 16 U/L (ref 7–52)
ANION GAP: 7 mmol/L (ref 4–13)
AST (SGOT): 21 U/L (ref 13–39)
BILIRUBIN TOTAL: 0.6 mg/dL (ref 0.3–1.0)
BUN/CREA RATIO: 25 — ABNORMAL HIGH (ref 6–22)
BUN: 22 mg/dL (ref 7–25)
CALCIUM, CORRECTED: 9.6 mg/dL (ref 8.9–10.8)
CALCIUM: 9.8 mg/dL (ref 8.6–10.3)
CHLORIDE: 108 mmol/L — ABNORMAL HIGH (ref 98–107)
CO2 TOTAL: 25 mmol/L (ref 21–31)
CREATININE: 0.87 mg/dL (ref 0.60–1.30)
ESTIMATED GFR: 71 mL/min/{1.73_m2} (ref >59)
GLOBULIN: 3.4 (ref 2.0–3.5)
GLUCOSE: 116 mg/dL — ABNORMAL HIGH (ref 74–109)
OSMOLALITY, CALCULATED: 284 mosm/kg (ref 270–290)
POTASSIUM: 4.9 mmol/L (ref 3.5–5.1)
PROTEIN TOTAL: 7.6 g/dL (ref 6.4–8.9)
SODIUM: 140 mmol/L (ref 136–145)

## 2023-06-30 LAB — URINALYSIS, MICROSCOPIC: WBCS: <1 /HPF (ref <6)

## 2023-06-30 LAB — URINALYSIS, MACROSCOPIC
BILIRUBIN: NEGATIVE mg/dL
BLOOD: NEGATIVE mg/dL
GLUCOSE: NEGATIVE mg/dL
KETONES: NEGATIVE mg/dL
LEUKOCYTES: NEGATIVE WBCs/uL
NITRITE: NEGATIVE
PH: 5.5 (ref 5.0–9.0)
PROTEIN: NEGATIVE mg/dL
SPECIFIC GRAVITY: >1.03 — ABNORMAL HIGH (ref 1.002–1.030)
UROBILINOGEN: NORMAL mg/dL

## 2023-06-30 LAB — CBC WITH DIFF
BASOPHIL #: 0 10*3/uL (ref 0.00–0.10)
BASOPHIL %: 1 % (ref 0–1)
EOSINOPHIL #: 0.1 10*3/uL (ref 0.00–0.50)
EOSINOPHIL %: 2 % (ref 1–7)
HCT: 39.1 % (ref 31.2–41.9)
HGB: 13.1 g/dL (ref 10.9–14.3)
LYMPHOCYTE #: 1.4 10*3/uL (ref 1.10–3.10)
LYMPHOCYTE %: 23 % (ref 16–46)
MCH: 29.2 pg (ref 24.7–32.8)
MCHC: 33.3 g/dL (ref 32.3–35.6)
MCV: 87.6 fL (ref 75.5–95.3)
MONOCYTE #: 0.4 10*3/uL (ref 0.20–0.90)
MONOCYTE %: 7 % (ref 4–11)
MPV: 8 fL (ref 7.9–10.8)
NEUTROPHIL #: 3.9 10*3/uL (ref 1.90–8.20)
NEUTROPHIL %: 67 % (ref 43–77)
PLATELETS: 254 10*3/uL (ref 140–440)
RBC: 4.47 10*6/uL (ref 3.63–4.92)
RDW: 14.3 % (ref 12.3–17.7)
WBC: 5.9 10*3/uL (ref 3.8–11.8)

## 2023-06-30 LAB — LACTIC ACID LEVEL W/ REFLEX FOR LEVEL >2.0: LACTIC ACID: 0.8 mmol/L (ref 0.5–2.2)

## 2023-06-30 LAB — BLUE TOP TUBE

## 2023-06-30 LAB — C-REACTIVE PROTEIN(CRP),INFLAMMATION: C-REACTIVE PROTEIN (CRP): 0.5 mg/dL (ref 0.1–0.5)

## 2023-06-30 LAB — GOLD TOP TUBE

## 2023-06-30 MED ORDER — CIPROFLOXACIN 0.3 %-DEXAMETHASONE 0.1 % EAR DROPS,SUSPENSION
4.0000 [drp] | Freq: Two times a day (BID) | OTIC | 0 refills | Status: DC
Start: 2023-06-30 — End: 2023-09-28

## 2023-06-30 MED ORDER — IOHEXOL 350 MG IODINE/ML INTRAVENOUS SOLUTION
100.0000 mL | INTRAVENOUS | Status: AC
Start: 2023-06-30 — End: 2023-06-30
  Administered 2023-06-30: 80 mL via INTRAVENOUS

## 2023-06-30 NOTE — H&P (Signed)
 ENT, [facility]  [address]  [place] [address]  Operated by [facility]  History and Physical    Name: THIYA AGUDELO MRN:  [id number]   Date: 06/30/2023 DOB:  May 23, 1950 (73 y.o.)            New Patient      Chief Complaint:    Chief Complaint   Patient presents with    Ear Problem(s)     States having fullness and pain in left ear and left jaw and neck.       HPI:  CATLYN KEIGLEY is a 73 y.o. female presenting for a new patient visit. Patient states that a few weeks ago she was having left sided aural fullness and otalgia.  She recently finished abx with some improvement.  She is using Flonase  currently.  She does wear hearing aids and thinks that maybe it may be worse.  She denies otorrhea.  She denies any bruxism.    Tymps type A As and type C Ad.   Patient also had recent thyroid US  that shows new TR4 6 mm nodule.      Past Medical History:   Diagnosis Date    Hyperlipidemia     Hypothyroidism     Prediabetes     Spinal stenosis     [facility]    Spinal stenosis 09/29/2021    I was diagnosed in the fall.  I lost some of the use of my left arm.  I went to [facility]  Therapy in [place] lasted from October 2023 to January 2024.           Past Surgical History:   Procedure Laterality Date    HX WISDOM TEETH EXTRACTION           Family Medical History:       Problem Relation (Age of Onset)    Bone cancer Mother    Breast Cancer Mother    Diabetes Brother    Diabetes type II Mother, Brother, Brother, Brother, Brother    Heart Disease Mother, Father, Brother, Brother, Brother, Brother    Hypertension (High Blood Pressure) Sister    Irregular heart beat Sister    No Known Problems Maternal Aunt, Maternal Uncle, Paternal Aunt, Paternal Uncle, Maternal Grandmother, Maternal Grandfather, Paternal Grandmother, Paternal Grandfather, Daughter, Son, Other    Pacemaker Brother            Social History     Tobacco Use    Smoking status: Never    Smokeless tobacco: Never   Vaping Use     Vaping status: Never Used   Substance Use Topics    Alcohol use: Not Currently    Drug use: Not Currently        Medications:  Current Outpatient Medications   Medication Sig    atorvastatin  (LIPITOR) 20 mg Oral Tablet Take 1 Tablet (20 mg total) by mouth Once a day    cholecalciferol, vitamin D3, 25 mcg (1,000 unit) Oral Tablet Take 1 Tablet (1,000 Units total) by mouth Daily    ciprofloxacin-dexAMETHasone (CIPRODEX) 0.3-0.1 % Otic Drops, Suspension Administer 4 Drops into the left ear Twice daily Instill 4 drops into Left Ear twice daily    fluticasone  propionate (FLONASE ) 50 mcg/actuation Nasal Spray, Suspension Administer 1 Spray into each nostril Daily    levothyroxine (SYNTHROID) 50 mcg Oral Tablet Take 1 Tablet (50 mcg total) by mouth Every morning    metFORMIN (GLUCOPHAGE XR) 500 mg Oral Tablet  Sustained Release 24 hr Take 1 Tablet (500 mg total) by mouth Daily    multivit-min-iron -FA-vit K-lut (CENTRUM SILVER  WOMEN) 8 mg iron -400 mcg-50 mcg Oral Tablet Take 1 Tablet by mouth Once a day       Allergies:  No Known Allergies    Review of Systems:  Review of Systems     Physical Exam:  Vitals:    06/30/23 0902   Weight: 80.3 kg (177 lb)   Height: 1.626 m (5\' 4" )   BMI: 30.38      ENT Physical Exam     Assessment and Plan:    ICD-10-CM    1. Hearing loss, unspecified hearing loss type, unspecified laterality  H91.90       2. Ear pain, left  H92.02 POCT HEARING/VISION/TYMPANOGRAM (AMB ONLY)     31231 - NASAL ENDOSCOPY DIAGNOSTIC UNILATERAL OR BILATERAL (AMB ONLY)     [facility]      3. Thyroid nodule  E04.1       4. ETD (Eustachian tube dysfunction), bilateral  H69.93         Orders Placed This Encounter    31231 - NASAL ENDOSCOPY DIAGNOSTIC UNILATERAL OR BILATERAL (AMB ONLY)    [facility]    POCT HEARING/VISION/TYMPANOGRAM (AMB ONLY)    ciprofloxacin-dexAMETHasone (CIPRODEX) 0.3-0.1 % Otic Drops, Suspension    Recommend repeat US   in 6 months.   Will check audiogram.   Will give ciprodex  Continue Flonase       Follow Up:  Return for Follow up after audiogram.     Norah Beals, DO

## 2023-06-30 NOTE — ED Provider Notes (Signed)
 Emergency Medicine      Name: Whitney Vang  Age and Gender: 73 y.o. female  Date of Birth: [birth year]  MRN: [id number]  PCP: Whitney Campi, MD    CC:  Chief Complaint   Patient presents with    Flank Pain    Abdominal Pain       HPI:  Whitney Vang is a 73 y.o. White female who presents to the ER with right flank pain.  Patient states flank and abdominal pain started last week.  She says she saw her PCP who treated her with cefdinir with no improvement.  She does report some associated nausea, but states there is no relationship to food.  Movement and certain positions do seem to increase her pain, but patient denies any known injury.  Denies dysuria, hematuria or urinary frequency as well as fever.    Below pertinent information reviewed with patient:  Past Medical History:   Diagnosis Date    Hyperlipidemia     Hypothyroidism     Prediabetes     Spinal stenosis     [facility]    Spinal stenosis 09/29/2021    I was diagnosed in the fall.  I lost some of the use of my left arm.  I went to [facility].  Therapy in [place] lasted from October 2023 to January 2024.             No Known Allergies    Past Surgical History:   Procedure Laterality Date    COLONOSCOPY N/A 12/23/2021    Performed by Eliot Guernsey, MD at [facility]    HX WISDOM TEETH EXTRACTION          Social History     Socioeconomic History    Marital status: Divorced   Tobacco Use    Smoking status: Never    Smokeless tobacco: Never   Vaping Use    Vaping status: Never Used   Substance and Sexual Activity    Alcohol use: Not Currently    Drug use: Not Currently       ROS:  No other overt positive review of systems are noted other than stated in the HPI.      Objective:    ED Triage Vitals [06/30/23 0939]   BP (Non-Invasive) (!) 149/80   Heart Rate 75   Respiratory Rate 19   Temperature 36.7 C (98 F)   SpO2 98 %   Weight 78 kg (172 lb)   Height 1.575 m (5\' 2" )     Filed Vitals:    06/30/23 1430 06/30/23 1445 06/30/23 1500 06/30/23 1515   BP:  133/70 123/69 132/72 (!) 140/79   Pulse: 70 67 70 74   Resp: 14 12 13 20    Temp:       SpO2: 98% 97% 97% 99%       Nursing notes and vital signs reviewed.    Constitutional - No acute distress.  Alert and Active.  HEENT - Normocephalic.  Conjunctiva clear. Moist mucous membranes.   Neck - Trachea midline. No stridor. No hoarseness.  Cardiac - Regular rate and rhythm. No murmurs, rubs, or gallops.   Respiratory/Chest - Normal respiratory effort. Clear to auscultation bilaterally. No rales, wheezes or rhonchi.   Abdomen - Normal bowel sounds. Non-tender, soft, non-distended. No rebound or guarding.   Musculoskeletal - Good AROM. No muscle or joint tenderness appreciated. No clubbing, cyanosis or edema.  Skin - Warm and dry, without any  rashes or other lesions.  Neuro - Alert and oriented x 3.  Moving all extremities symmetrically.   Psych - Normal mood and affect. Behavior is normal         Any pertinent labs and imaging obtained during this encounter reviewed below in MDM.    MDM/ED Course:      Medical Decision Making  Patient presents to the ER with right flank pain.  Patient states flank and abdominal pain started last week.  She says she saw her PCP who treated her with cefdinir with no improvement.  She does report some associated nausea, but states there is no relationship to food.  Movement and certain positions do seem to increase her pain, but patient denies any known injury.  Denies dysuria, hematuria or urinary frequency as well as fever.    Differential diagnoses include but are not limited to:  Biliary colic, renal colic, constipation    Patient underwent diagnostics with results as noted in ED course.    Patient was found/suspected to have cholelithiasis, constipation.  Recommended she take Tylenol as needed for pain and over-the-counter Benefiber and MiraLax for constipation.  Will be discharged with a referral to Dr. Cathay Clonts for further evaluation    Patient will be discharged in stable condition at  this time.    Amount and/or Complexity of Data Reviewed  Labs: ordered. Decision-making details documented in ED Course.  Radiology: ordered. Decision-making details documented in ED Course.  ECG/medicine tests: independent interpretation performed.          ED Course as of 06/30/23 1810   Thu Jun 30, 2023   1210 CT ABDOMEN PELVIS W IV CONTRAST  1.NO ACUTE FINDINGS AT THE ABDOMEN OR PELVIS ON CONTRAST-ENHANCED CT  2.Gallstones   1403 CBC/DIFF  Normal   1403 C-REACTIVE PROTEIN (CRP): 0.5   1403 LACTIC ACID: 0.8   1403 COMPREHENSIVE METABOLIC PANEL, NON-FASTING(!)  Unremarkable       Orders Placed This Encounter    CT ABDOMEN PELVIS W IV CONTRAST    US  RT UPPER QUADRANT    CBC/DIFF    COMPREHENSIVE METABOLIC PANEL, NON-FASTING    URINALYSIS, MACROSCOPIC AND MICROSCOPIC W/CULTURE REFLEX    C-REACTIVE PROTEIN(CRP),INFLAMMATION    LACTIC ACID LEVEL W/ REFLEX FOR LEVEL >2.0    CBC WITH DIFF    URINALYSIS, MACROSCOPIC    URINALYSIS, MICROSCOPIC    EXTRA TUBES    BLUE TOP TUBE    GOLD TOP TUBE    FOLLOW-UP: GENERAL SURGERY (HOPKINS/MULLINS/WILLS) - PRN - [facility] - Kenedy, Aline    iohexol (OMNIPAQUE 350) infusion         Impression:   Clinical Impression   Calculus of gallbladder without cholecystitis without obstruction (Primary)   Constipation, unspecified constipation type       Disposition: Discharged    / Kristoph Sattler PA-C  06/30/2023, 10:25  [facility], [place] Departments of Emergency Medicine  [place]    Portions of this note may have been dictated using voice recognition software.     -----------------------  Results for orders placed or performed during the hospital encounter of 06/30/23 (from the past 12 hours)   COMPREHENSIVE METABOLIC PANEL, NON-FASTING   Result Value Ref Range    SODIUM 140 136 - 145 mmol/L    POTASSIUM 4.9 3.5 - 5.1 mmol/L    CHLORIDE 108 (H) 98 - 107 mmol/L    CO2 TOTAL 25 21 - 31 mmol/L    ANION GAP 7  4 - 13 mmol/L    BUN 22 7 - 25  mg/dL    CREATININE 1.61 0.96 - 1.30 mg/dL    BUN/CREA RATIO 25 (H) 6 - 22    ESTIMATED GFR 71 >59 mL/min/1.75m^2    ALBUMIN 4.2 3.5 - 5.7 g/dL    CALCIUM 9.8 8.6 - 04.5 mg/dL    GLUCOSE 409 (H) 74 - 109 mg/dL    ALKALINE PHOSPHATASE 69 34 - 104 U/L    ALT (SGPT) 16 7 - 52 U/L    AST (SGOT) 21 13 - 39 U/L    BILIRUBIN TOTAL 0.6 0.3 - 1.0 mg/dL    PROTEIN TOTAL 7.6 6.4 - 8.9 g/dL    ALBUMIN/GLOBULIN RATIO 1.2 0.8 - 1.4    OSMOLALITY, CALCULATED 284 270 - 290 mOsm/kg    CALCIUM, CORRECTED 9.6 8.9 - 10.8 mg/dL    GLOBULIN 3.4 2.0 - 3.5   C-REACTIVE PROTEIN(CRP),INFLAMMATION   Result Value Ref Range    C-REACTIVE PROTEIN (CRP) 0.5 0.1 - 0.5 mg/dL   LACTIC ACID LEVEL W/ REFLEX FOR LEVEL >2.0   Result Value Ref Range    LACTIC ACID 0.8 0.5 - 2.2 mmol/L   CBC WITH DIFF   Result Value Ref Range    WBC 5.9 3.8 - 11.8 x10^3/uL    RBC 4.47 3.63 - 4.92 x10^6/uL    HGB 13.1 10.9 - 14.3 g/dL    HCT 81.1 91.4 - 78.2 %    MCV 87.6 75.5 - 95.3 fL    MCH 29.2 24.7 - 32.8 pg    MCHC 33.3 32.3 - 35.6 g/dL    RDW 95.6 21.3 - 08.6 %    PLATELETS 254 140 - 440 x10^3/uL    MPV 8.0 7.9 - 10.8 fL    NEUTROPHIL % 67 43 - 77 %    LYMPHOCYTE % 23 16 - 46 %    MONOCYTE % 7 4 - 11 %    EOSINOPHIL % 2 1 - 7 %    BASOPHIL % 1 0 - 1 %    NEUTROPHIL # 3.90 1.90 - 8.20 x10^3/uL    LYMPHOCYTE # 1.40 1.10 - 3.10 x10^3/uL    MONOCYTE # 0.40 0.20 - 0.90 x10^3/uL    EOSINOPHIL # 0.10 0.00 - 0.50 x10^3/uL    BASOPHIL # 0.00 0.00 - 0.10 x10^3/uL   BLUE TOP TUBE   Result Value Ref Range    RAINBOW/EXTRA TUBE AUTO RESULT Yes    GOLD TOP TUBE   Result Value Ref Range    RAINBOW/EXTRA TUBE AUTO RESULT Yes    URINALYSIS, MACROSCOPIC   Result Value Ref Range    COLOR Light Yellow Colorless, Light Yellow, Yellow    APPEARANCE Clear Clear    SPECIFIC GRAVITY >1.030 (H) 1.002 - 1.030    PH 5.5 5.0 - 9.0    LEUKOCYTES Negative Negative, 100  WBCs/uL    NITRITE Negative Negative    PROTEIN Negative Negative, 10 , 20  mg/dL    GLUCOSE Negative Negative, 30  mg/dL     KETONES Negative Negative, Trace mg/dL    BILIRUBIN Negative Negative, 0.5 mg/dL    BLOOD Negative Negative, 0.03 mg/dL    UROBILINOGEN Normal Normal mg/dL   URINALYSIS, MICROSCOPIC   Result Value Ref Range    MUCOUS Rare Rare, Occasional, Few /hpf    RBCS      WBCS <1 <6 /hpf     US  RT UPPER QUADRANT   Final Result  CHOLELITHIASIS.            Radiologist location ID: [id number]         CT ABDOMEN PELVIS W IV CONTRAST   Final Result   1. NO ACUTE FINDINGS AT THE ABDOMEN OR PELVIS ON CONTRAST-ENHANCED CT   2. Gallstones            Radiologist location ID: [id number]

## 2023-06-30 NOTE — Discharge Instructions (Signed)
 Miralax and benefiber as discussed for constipation. Tylenol as needed for pain. Follow up with Dr Cathay Clonts for further evaluation. Return to the ER for other emergencies or as needed

## 2023-06-30 NOTE — ED Nurses Note (Signed)
 Pt. Arrived via triage with c/o RUQ abdominal pain and R flank pain X 1 week. Pt. Reports pain as aching and worsening when rolling over and lying on the R side. Pt. Reports recently being treated for mild UTI. Pt. Denies pain on palpation of RUQ. Pt. Denies diarrhea or constipation. Pt. Reports provider asking her to "be checked for her gallbladder." Pt. Denies N/V. VSS, NADN.

## 2023-06-30 NOTE — ED Nurses Note (Signed)
 Pt. Provided with D/C instructions at this time. No further questions or concerns. VSS, NADN.

## 2023-06-30 NOTE — ED Triage Notes (Signed)
 Pt having right sided flank pain and abdominal pain. Was treated with antibiotics days finished them this past sat.

## 2023-06-30 NOTE — Procedures (Signed)
 ENT, [facility]  [address]  [place] [address]  Operated by [facility]  Procedure Note    Name: Whitney Vang MRN:  [id number]   Date: 06/30/2023 DOB:  11/03/1950 (72 y.o.)         [address]31 - NASAL ENDOSCOPY DIAGNOSTIC UNILATERAL OR BILATERAL (AMB ONLY)    Performed by: Norah Beals, DO  Authorized by: Norah Beals, DO    Time Out:     Immediately before the procedure, a time out was called:  Yes    Patient verified:  Yes    Procedure Verified:  Yes    Site Verified:  Yes  Documentation:      ENT, [facility]  [address]  [place] [address]  Operated by [facility]  Procedure Note    Name: Whitney Vang MRN:  [id number]  Date: 06/30/2023 DOB:  Nov 16, 1950 (73 y.o.)        @PROCDOC @    Indications for procedure: R/O foreign body or mass    Anesthesia: Oxymetazoline nasal spray    Description: Nasal endoscopy with rigid scope was performed with examination of the  septum, inferior, middle, and superior meatus, turbinates, sphenoethmoidal recess, and nasopharynx.     There were no polyps, pus, or granulation tissue noted.  ET orifices and nasopharynx were normal.     Findings: No masses or lesions    The patient tolerated the procedure well.    Norah Beals, DO             Emma Birchler Tres Arroyos, DO

## 2023-07-01 ENCOUNTER — Telehealth (HOSPITAL_COMMUNITY): Payer: Self-pay | Admitting: Internal Medicine

## 2023-07-01 ENCOUNTER — Telehealth (INDEPENDENT_AMBULATORY_CARE_PROVIDER_SITE_OTHER): Payer: Self-pay | Admitting: OTOLARYNGOLOGY

## 2023-07-01 NOTE — Telephone Encounter (Signed)
 Lm informing the patient of date and time of audio and follow up appointments.

## 2023-07-01 NOTE — Telephone Encounter (Signed)
 Post Ed Follow-Up    Post ED Follow-Up:   Document completed and/or attempted interactive contact(s) after transition to home after emergency department stay.:   Transition Facility and relevant Date:   Discharge Date: 06/30/23  Discharge from [facility] Emergency Department?: Yes  Discharge Facility: [facility]  Contacted by: Leda Prude RN  Contact method: Patient/Caregiver Telephone  Contact first attempt: 07/01/2023  2:53 PM  MyChart message sent?: No  Follow Up Visit: No answer - left message

## 2023-07-04 ENCOUNTER — Encounter (INDEPENDENT_AMBULATORY_CARE_PROVIDER_SITE_OTHER): Payer: Self-pay | Admitting: Internal Medicine

## 2023-07-04 DIAGNOSIS — K8020 Calculus of gallbladder without cholecystitis without obstruction: Secondary | ICD-10-CM | POA: Insufficient documentation

## 2023-07-26 ENCOUNTER — Ambulatory Visit (INDEPENDENT_AMBULATORY_CARE_PROVIDER_SITE_OTHER): Payer: Self-pay | Admitting: Surgery

## 2023-07-26 ENCOUNTER — Other Ambulatory Visit (INDEPENDENT_AMBULATORY_CARE_PROVIDER_SITE_OTHER): Payer: Self-pay | Admitting: Surgery

## 2023-07-26 ENCOUNTER — Ambulatory Visit (HOSPITAL_COMMUNITY)
Admission: RE | Admit: 2023-07-26 | Discharge: 2023-07-26 | Disposition: A | Discharge status: Home / Self Care | Source: Ambulatory Visit | Attending: Surgery

## 2023-07-26 ENCOUNTER — Encounter (INDEPENDENT_AMBULATORY_CARE_PROVIDER_SITE_OTHER): Payer: Self-pay | Admitting: Surgery

## 2023-07-26 ENCOUNTER — Other Ambulatory Visit: Payer: Self-pay

## 2023-07-26 ENCOUNTER — Ambulatory Visit: Attending: Surgery

## 2023-07-26 VITALS — BP 145/80 | HR 85 | Temp 97.9°F | Wt 175.0 lb

## 2023-07-26 DIAGNOSIS — Z01818 Encounter for other preprocedural examination: Secondary | ICD-10-CM

## 2023-07-26 DIAGNOSIS — K8020 Calculus of gallbladder without cholecystitis without obstruction: Secondary | ICD-10-CM

## 2023-07-26 LAB — CBC WITH DIFF
BASOPHIL #: 0 10*3/uL (ref 0.00–0.10)
BASOPHIL %: 1 % (ref 0–1)
EOSINOPHIL #: 0.1 10*3/uL (ref 0.00–0.50)
EOSINOPHIL %: 2 % (ref 1–7)
HCT: 37.9 % (ref 31.2–41.9)
HGB: 12.9 g/dL (ref 10.9–14.3)
LYMPHOCYTE #: 1.9 10*3/uL (ref 1.10–3.10)
LYMPHOCYTE %: 26 % (ref 16–46)
MCH: 29.1 pg (ref 24.7–32.8)
MCHC: 34 g/dL (ref 32.3–35.6)
MCV: 85.5 fL (ref 75.5–95.3)
MONOCYTE #: 0.6 10*3/uL (ref 0.20–0.90)
MONOCYTE %: 8 % (ref 4–11)
MPV: 8.2 fL (ref 7.9–10.8)
NEUTROPHIL #: 4.9 10*3/uL (ref 1.90–8.20)
NEUTROPHIL %: 65 % (ref 43–77)
PLATELETS: 257 10*3/uL (ref 140–440)
RBC: 4.43 10*6/uL (ref 3.63–4.92)
RDW: 13.8 % (ref 12.3–17.7)
WBC: 7.7 10*3/uL (ref 3.8–11.8)

## 2023-07-26 LAB — COMPREHENSIVE METABOLIC PANEL, NON-FASTING
ALBUMIN/GLOBULIN RATIO: 1.4 (ref 0.8–1.4)
ALBUMIN: 4.3 g/dL (ref 3.5–5.7)
ALKALINE PHOSPHATASE: 73 U/L (ref 34–104)
ALT (SGPT): 17 U/L (ref 7–52)
ANION GAP: 7 mmol/L (ref 4–13)
AST (SGOT): 23 U/L (ref 13–39)
BILIRUBIN TOTAL: 0.4 mg/dL (ref 0.3–1.0)
BUN/CREA RATIO: 23 — ABNORMAL HIGH (ref 6–22)
BUN: 18 mg/dL (ref 7–25)
CALCIUM, CORRECTED: 9.3 mg/dL (ref 8.9–10.8)
CALCIUM: 9.5 mg/dL (ref 8.6–10.3)
CHLORIDE: 105 mmol/L (ref 98–107)
CO2 TOTAL: 30 mmol/L (ref 21–31)
CREATININE: 0.79 mg/dL (ref 0.60–1.30)
ESTIMATED GFR: 79 mL/min/{1.73_m2} (ref >59)
GLOBULIN: 3.1 (ref 2.0–3.5)
GLUCOSE: 92 mg/dL (ref 74–109)
OSMOLALITY, CALCULATED: 285 mosm/kg (ref 270–290)
POTASSIUM: 4 mmol/L (ref 3.5–5.1)
PROTEIN TOTAL: 7.4 g/dL (ref 6.4–8.9)
SODIUM: 142 mmol/L (ref 136–145)

## 2023-07-26 NOTE — Progress Notes (Signed)
 GENERAL SURGERY, [facility]  [address]  [place] [address]    History and Physical    Name: Whitney Vang MRN:  [id number]   Date: 07/26/2023 DOB:  [birth year] (73 y.o.)              Reason for Visit: Gallbladder    History of Present Illness  Whitney Vang presents today for laparoscopic cholecystectomy with cholangiogram due to symptomatic cholelithiasis. Whitney Vang had been experiencing RUQ discomfort radiating to Whitney back. Ultrasound of Whitney gallbladder showed gallstones.    Positive DM  Negative blood thinner      Review of Whitney result(s) of each unique test:  Vang underwent diagnostic testing ( as above ) prior to this dates visit.  I have personally reviewed Whitney results and that serves as a component of Whitney medical decision making for this encounter       Review of prior external note(s) from each unique source:  Whitney Vang to this office including a recent assessment by Whitney referring provider.  This was reviewed by me for this unique office visit for Whitney indication and intent of Whitney Vang as well as any pertinent medical or surgical history relevant to Whitney Whitney independent evaluation by me today.      Vang History  Past Medical History:   Diagnosis Date    Hyperlipidemia     Hypothyroidism     Prediabetes     Spinal stenosis     [facility]    Spinal stenosis 09/29/2021    I was diagnosed in Whitney fall.  I lost some of Whitney use of my left arm.  I went to [facility].  Therapy in [place] lasted from October 2023 to January 2024.           Past Surgical History:   Procedure Laterality Date    HX WISDOM TEETH EXTRACTION           Current Outpatient Medications   Medication Sig    atorvastatin  (LIPITOR) 20 mg Oral Tablet Take 1 Tablet (20 mg total) by mouth Once a day    cholecalciferol, vitamin D3, 25 mcg (1,000 unit) Oral Tablet Take 1 Tablet (1,000 Units total) by mouth Daily    ciprofloxacin -dexAMETHasone  (CIPRODEX ) 0.3-0.1 % Otic Drops, Suspension  Administer 4 Drops into Whitney left ear Twice daily Instill 4 drops into Left Ear twice daily    fluticasone  propionate (FLONASE ) 50 mcg/actuation Nasal Spray, Suspension Administer 1 Spray into each nostril Daily    levothyroxine (SYNTHROID) 50 mcg Oral Tablet Take 1 Tablet (50 mcg total) by mouth Every morning    metFORMIN (GLUCOPHAGE XR) 500 mg Oral Tablet Sustained Release 24 hr Take 1 Tablet (500 mg total) by mouth Daily    multivit-min-iron -FA-vit K-lut (CENTRUM SILVER  WOMEN) 8 mg iron -400 mcg-50 mcg Oral Tablet Take 1 Tablet by mouth Once a day     No Known Allergies  Family Medical History:       Problem Relation (Age of Onset)    Bone cancer Mother    Breast Cancer Mother    Diabetes Brother    Diabetes type II Mother, Brother, Brother, Brother, Brother    Heart Disease Mother, Father, Brother, Brother, Brother, Brother    Hypertension (High Blood Pressure) Sister    Irregular heart beat Sister    No Known Problems Maternal Aunt, Maternal Uncle, Paternal Aunt, Paternal Uncle, Maternal Grandmother, Maternal Grandfather, Paternal Grandmother, Paternal Grandfather, Daughter, Son, Other  Pacemaker Brother            Social History     Tobacco Use    Smoking status: Never    Smokeless tobacco: Never   Vaping Use    Vaping status: Never Used   Substance Use Topics    Alcohol use: Not Currently    Drug use: Not Currently            Physical Examination:  Vitals:    07/26/23 1357   BP: (!) 145/80   Pulse: 85   Temp: 36.6 C (97.9 F)   SpO2: 96%   Weight: 79.4 kg (175 lb)        General: appropriate for age. in no acute distress.    Vital signs are present above and have been reviewed by me     HEENT: Atraumatic, Normocephalic. PERRLA. EOMI. Nose clear. Throat clear    Lungs: Nonlabored breathing with symmetric expansion. Clear to auscultation bilaterally    Heart:Regular wth respect to rate and rythmn.    Abdomen:Soft. Slightly tender to deep palpation in Whitney RUQ. Nondistended and otherwise benign    Extremities:  Grossly normal. No major deformities     Neuro:  Grossly normal motor and sensory function    Psychiatric: Alert and oriented to person, place, and time. affect appropriate      Assessment and Plan  Laparoscopic cholecystectomy with cholangiogram scheduled for  08/04/23    Risks, benefits, indications, and complications of laparoscopic possible open cholecystectomy were discussed in detail including but not limited to conversion to open procedure, common bile duct injury, bleeding, retained stone, postoperative bile leak, postoperative hernia, postoperative diarrhea, reaction to anesthesia, injury to associated or surrounding organs, wound infection, and remote possibility of death.  Also discussed Whitney possibility of lack of resolution of all preoperative symptoms from Whitney procedure.  Whitney Vang was given ample opportunity to ask questions and all questions were answered to their satisfaction. Whitney Vang voices understanding of Whitney procedure and wishes to proceed with surgery.  Informed consent clearly obtained.      Follow Up:  No follow-ups on file.      ICD-10-CM    1. Gallstones  K80.20           Adine Heimann B Haile Toppins, MD ,MBA,FACS    I appreciate Whitney opportunity to be involved in Whitney care of your Whitney.  If you have any questions or concerns regarding this encounter, please do not hesitate to contact me at your convenience.      This note may have been partially generated using MModal Fluency Direct system, and there may be some incorrect words, spellings, and punctuation that were not noted in checking Whitney note before saving, though effort was made to avoid such errors.

## 2023-07-27 DIAGNOSIS — K8020 Calculus of gallbladder without cholecystitis without obstruction: Secondary | ICD-10-CM

## 2023-07-27 DIAGNOSIS — Z01818 Encounter for other preprocedural examination: Secondary | ICD-10-CM

## 2023-07-27 LAB — ECG 12 LEAD
Atrial Rate: 79 {beats}/min
Calculated P Axis: 63 degrees
Calculated R Axis: 80 degrees
Calculated T Axis: 17 degrees
PR Interval: 150 ms
QRS Duration: 72 ms
QT Interval: 360 ms
QTC Calculation: 412 ms
Ventricular rate: 79 {beats}/min

## 2023-08-03 ENCOUNTER — Encounter (HOSPITAL_COMMUNITY): Payer: Self-pay | Admitting: Surgery

## 2023-08-04 ENCOUNTER — Ambulatory Visit (HOSPITAL_COMMUNITY)

## 2023-08-04 ENCOUNTER — Ambulatory Visit (HOSPITAL_COMMUNITY): Admitting: ANESTHESIOLOGY

## 2023-08-04 ENCOUNTER — Other Ambulatory Visit: Payer: Self-pay

## 2023-08-04 ENCOUNTER — Ambulatory Visit (HOSPITAL_COMMUNITY): Admitting: Surgery

## 2023-08-04 ENCOUNTER — Ambulatory Visit
Admission: RE | Admit: 2023-08-04 | Discharge: 2023-08-04 | Disposition: A | Discharge status: Home / Self Care | Source: Ambulatory Visit | Attending: Surgery | Admitting: Surgery

## 2023-08-04 ENCOUNTER — Encounter (HOSPITAL_COMMUNITY)
Admission: RE | Disposition: A | Discharge status: Home / Self Care | Payer: Self-pay | Source: Ambulatory Visit | Attending: Surgery

## 2023-08-04 ENCOUNTER — Encounter (HOSPITAL_COMMUNITY): Payer: Self-pay | Admitting: Surgery

## 2023-08-04 DIAGNOSIS — E785 Hyperlipidemia, unspecified: Secondary | ICD-10-CM | POA: Insufficient documentation

## 2023-08-04 DIAGNOSIS — E039 Hypothyroidism, unspecified: Secondary | ICD-10-CM | POA: Insufficient documentation

## 2023-08-04 DIAGNOSIS — Z7984 Long term (current) use of oral hypoglycemic drugs: Secondary | ICD-10-CM | POA: Insufficient documentation

## 2023-08-04 DIAGNOSIS — K8020 Calculus of gallbladder without cholecystitis without obstruction: Secondary | ICD-10-CM | POA: Insufficient documentation

## 2023-08-04 DIAGNOSIS — K219 Gastro-esophageal reflux disease without esophagitis: Secondary | ICD-10-CM | POA: Insufficient documentation

## 2023-08-04 DIAGNOSIS — E119 Type 2 diabetes mellitus without complications: Secondary | ICD-10-CM | POA: Insufficient documentation

## 2023-08-04 HISTORY — DX: Presence of external hearing-aid: Z97.4

## 2023-08-04 HISTORY — PX: GALLBLADDER SURGERY: SHX652

## 2023-08-04 HISTORY — DX: Constipation, unspecified: K59.00

## 2023-08-04 HISTORY — DX: Personal history of other diseases of the nervous system and sense organs: Z86.69

## 2023-08-04 HISTORY — DX: Gastro-esophageal reflux disease without esophagitis: K21.9

## 2023-08-04 HISTORY — DX: Type 2 diabetes mellitus without complications: E11.9

## 2023-08-04 LAB — POC BLOOD GLUCOSE (RESULTS): GLUCOSE, POC: 110 mg/dL — ABNORMAL HIGH (ref 70–100)

## 2023-08-04 SURGERY — CHOLECYSTECTOMY LAPAROSCOPIC
Anesthesia: General | Site: Abdomen | Wound class: Clean Contaminated Wounds-The respiratory, GI, Genital, or urinary

## 2023-08-04 MED ORDER — CEFAZOLIN 2 GRAM INTRAVENOUS SOLUTION
INTRAVENOUS | Status: AC
Start: 2023-08-04 — End: 2023-08-04
  Filled 2023-08-04: qty 14.71

## 2023-08-04 MED ORDER — PHENYLEPHRINE 10 MG/ML INJECTION SOLUTION
Freq: Once | INTRAMUSCULAR | Status: DC | PRN
Start: 2023-08-04 — End: 2023-08-04
  Administered 2023-08-04: 100 ug via INTRAVENOUS

## 2023-08-04 MED ORDER — ONDANSETRON 4 MG DISINTEGRATING TABLET
4.0000 mg | ORAL_TABLET | Freq: Four times a day (QID) | ORAL | Status: DC | PRN
Start: 2023-08-04 — End: 2023-08-04
  Administered 2023-08-04: 4 mg via ORAL
  Filled 2023-08-04: qty 1

## 2023-08-04 MED ORDER — ONDANSETRON HCL (PF) 4 MG/2 ML INJECTION SOLUTION
4.0000 mg | Freq: Four times a day (QID) | INTRAMUSCULAR | Status: DC | PRN
Start: 2023-08-04 — End: 2023-08-04

## 2023-08-04 MED ORDER — ACETAMINOPHEN 1,000 MG/100 ML (10 MG/ML) INTRAVENOUS SOLUTION
INTRAVENOUS | Status: AC
Start: 2023-08-04 — End: 2023-08-04
  Filled 2023-08-04: qty 100

## 2023-08-04 MED ORDER — ROPIVACAINE (PF) 2 MG/ML (0.2 %) INJECTION SOLUTION
INTRAMUSCULAR | Status: AC
Start: 2023-08-04 — End: 2023-08-04
  Filled 2023-08-04: qty 40

## 2023-08-04 MED ORDER — CEFAZOLIN 1 GRAM SOLUTION FOR INJECTION
Freq: Once | INTRAMUSCULAR | Status: DC | PRN
Start: 2023-08-04 — End: 2023-08-04
  Administered 2023-08-04: 2000 mg via INTRAVENOUS

## 2023-08-04 MED ORDER — ROPIVACAINE (PF) 2 MG/ML (0.2 %) INJECTION SOLUTION
Freq: Once | INTRAMUSCULAR | Status: DC | PRN
Start: 2023-08-04 — End: 2023-08-04
  Administered 2023-08-04: 40 mL

## 2023-08-04 MED ORDER — PROCHLORPERAZINE EDISYLATE 10 MG/2 ML (5 MG/ML) INJECTION SOLUTION
5.0000 mg | Freq: Once | INTRAMUSCULAR | Status: DC | PRN
Start: 2023-08-04 — End: 2023-08-04

## 2023-08-04 MED ORDER — BACITRACIN ZINC 500 UNIT-POLYMYXIN B 10,000 UNIT/GRAM TOPICAL OINTMENT
TOPICAL_OINTMENT | Freq: Once | CUTANEOUS | Status: DC | PRN
Start: 2023-08-04 — End: 2023-08-04
  Administered 2023-08-04: 5 mL via TOPICAL

## 2023-08-04 MED ORDER — ACETAMINOPHEN 1,000 MG/100 ML (10 MG/ML) INTRAVENOUS SOLUTION
Freq: Once | INTRAVENOUS | Status: DC | PRN
Start: 2023-08-04 — End: 2023-08-04
  Administered 2023-08-04: 1000 mg via INTRAVENOUS

## 2023-08-04 MED ORDER — LIDOCAINE HCL 4 % LARYNGOTRACHEAL SOLUTION
LARYNGEAL | Status: AC
Start: 2023-08-04 — End: 2023-08-04
  Filled 2023-08-04: qty 1

## 2023-08-04 MED ORDER — FENTANYL (PF) 50 MCG/ML INJECTION SOLUTION
INTRAMUSCULAR | Status: AC
Start: 2023-08-04 — End: 2023-08-04
  Filled 2023-08-04: qty 2

## 2023-08-04 MED ORDER — HYDROCODONE 7.5 MG-ACETAMINOPHEN 325 MG TABLET
1.0000 | ORAL_TABLET | Freq: Four times a day (QID) | ORAL | 0 refills | Status: DC | PRN
Start: 2023-08-04 — End: 2023-09-28

## 2023-08-04 MED ORDER — MIDAZOLAM 5 MG/ML INJECTION WRAPPER
Freq: Once | INTRAMUSCULAR | Status: DC | PRN
Start: 2023-08-04 — End: 2023-08-04
  Administered 2023-08-04: 2 mg via INTRAVENOUS

## 2023-08-04 MED ORDER — ACETAMINOPHEN 325 MG TABLET
650.0000 mg | ORAL_TABLET | ORAL | Status: DC | PRN
Start: 2023-08-04 — End: 2023-08-04

## 2023-08-04 MED ORDER — ALBUTEROL SULFATE 2.5 MG/3 ML (0.083 %) SOLUTION FOR NEBULIZATION
2.5000 mg | INHALATION_SOLUTION | Freq: Once | RESPIRATORY_TRACT | Status: DC | PRN
Start: 2023-08-04 — End: 2023-08-04

## 2023-08-04 MED ORDER — FENTANYL (PF) 50 MCG/ML INJECTION WRAPPER
INJECTION | Freq: Once | INTRAMUSCULAR | Status: DC | PRN
Start: 2023-08-04 — End: 2023-08-04
  Administered 2023-08-04 (×2): 50 ug via INTRAVENOUS

## 2023-08-04 MED ORDER — ONDANSETRON HCL (PF) 4 MG/2 ML INJECTION SOLUTION
Freq: Once | INTRAMUSCULAR | Status: DC | PRN
Start: 2023-08-04 — End: 2023-08-04
  Administered 2023-08-04: 4 mg via INTRAVENOUS

## 2023-08-04 MED ORDER — BACITRACIN ZINC 500 UNIT-POLYMYXIN B 10,000 UNIT/GRAM TOPICAL OINTMENT
TOPICAL_OINTMENT | CUTANEOUS | Status: AC
Start: 2023-08-04 — End: 2023-08-04
  Filled 2023-08-04: qty 14.2

## 2023-08-04 MED ORDER — SODIUM CHLORIDE 0.9 % (FLUSH) INJECTION SYRINGE
3.0000 mL | INJECTION | Freq: Three times a day (TID) | INTRAMUSCULAR | Status: DC
Start: 2023-08-04 — End: 2023-08-04

## 2023-08-04 MED ORDER — KETOROLAC 30 MG/ML (1 ML) INJECTION SOLUTION
15.0000 mg | Freq: Four times a day (QID) | INTRAMUSCULAR | Status: DC | PRN
Start: 2023-08-04 — End: 2023-08-04

## 2023-08-04 MED ORDER — SODIUM CHLORIDE 0.9 % (FLUSH) INJECTION SYRINGE
3.0000 mL | INJECTION | INTRAMUSCULAR | Status: DC | PRN
Start: 2023-08-04 — End: 2023-08-04

## 2023-08-04 MED ORDER — HYDROCODONE 5 MG-ACETAMINOPHEN 325 MG TABLET
1.0000 | ORAL_TABLET | ORAL | Status: DC | PRN
Start: 2023-08-04 — End: 2023-08-04
  Administered 2023-08-04: 1 via ORAL
  Filled 2023-08-04: qty 1

## 2023-08-04 MED ORDER — FENTANYL (PF) 50 MCG/ML INJECTION WRAPPER
50.0000 ug | INJECTION | INTRAMUSCULAR | Status: DC | PRN
Start: 2023-08-04 — End: 2023-08-04

## 2023-08-04 MED ORDER — ETHYL ALCOHOL 62 % TOPICAL SWAB
1.0000 | Freq: Once | CUTANEOUS | Status: AC
Start: 2023-08-04 — End: 2023-08-04
  Administered 2023-08-04: 1 via NASAL

## 2023-08-04 MED ORDER — ONDANSETRON 4 MG DISINTEGRATING TABLET
4.0000 mg | ORAL_TABLET | Freq: Three times a day (TID) | ORAL | 0 refills | Status: DC | PRN
Start: 2023-08-04 — End: 2023-09-28

## 2023-08-04 MED ORDER — MIDAZOLAM 5 MG/ML INJECTION WRAPPER
INTRAMUSCULAR | Status: AC
Start: 2023-08-04 — End: 2023-08-04
  Filled 2023-08-04: qty 1

## 2023-08-04 MED ORDER — LIDOCAINE (PF) 100 MG/5 ML (2 %) INTRAVENOUS SYRINGE
INJECTION | Freq: Once | INTRAVENOUS | Status: DC | PRN
Start: 2023-08-04 — End: 2023-08-04
  Administered 2023-08-04: 50 mg via INTRAVENOUS

## 2023-08-04 MED ORDER — PROPOFOL 10 MG/ML IV BOLUS
INJECTION | Freq: Once | INTRAVENOUS | Status: DC | PRN
Start: 2023-08-04 — End: 2023-08-04
  Administered 2023-08-04: 150 mg via INTRAVENOUS

## 2023-08-04 MED ORDER — FAMOTIDINE (PF) 20 MG/2 ML INTRAVENOUS SOLUTION
INTRAVENOUS | Status: AC
Start: 2023-08-04 — End: 2023-08-04
  Filled 2023-08-04: qty 2

## 2023-08-04 MED ORDER — FAMOTIDINE (PF) 20 MG/2 ML INTRAVENOUS SOLUTION
Freq: Once | INTRAVENOUS | Status: DC | PRN
Start: 2023-08-04 — End: 2023-08-04
  Administered 2023-08-04: 20 mg via INTRAVENOUS

## 2023-08-04 MED ORDER — SUGAMMADEX 100 MG/ML INTRAVENOUS SOLUTION
Freq: Once | INTRAVENOUS | Status: DC | PRN
Start: 2023-08-04 — End: 2023-08-04
  Administered 2023-08-04: 200 mg via INTRAVENOUS

## 2023-08-04 MED ORDER — LACTATED RINGERS INTRAVENOUS SOLUTION
INTRAVENOUS | Status: DC
Start: 2023-08-04 — End: 2023-08-04

## 2023-08-04 MED ORDER — FENTANYL (PF) 50 MCG/ML INJECTION WRAPPER
25.0000 ug | INJECTION | INTRAMUSCULAR | Status: DC | PRN
Start: 2023-08-04 — End: 2023-08-04

## 2023-08-04 MED ORDER — IPRATROPIUM 0.5 MG-ALBUTEROL 3 MG (2.5 MG BASE)/3 ML NEBULIZATION SOLN
3.0000 mL | INHALATION_SOLUTION | Freq: Once | RESPIRATORY_TRACT | Status: DC | PRN
Start: 2023-08-04 — End: 2023-08-04

## 2023-08-04 MED ORDER — ROCURONIUM 10 MG/ML INTRAVENOUS SOLUTION
Freq: Once | INTRAVENOUS | Status: DC | PRN
Start: 2023-08-04 — End: 2023-08-04
  Administered 2023-08-04: 50 mg via INTRAVENOUS

## 2023-08-04 MED ORDER — HYDROMORPHONE 2 MG/ML INJECTION WRAPPER
0.2000 mg | INJECTION | INTRAMUSCULAR | Status: DC | PRN
Start: 2023-08-04 — End: 2023-08-04

## 2023-08-04 MED ORDER — METOCLOPRAMIDE 5 MG/ML INJECTION SOLUTION
10.0000 mg | Freq: Four times a day (QID) | INTRAMUSCULAR | Status: DC | PRN
Start: 2023-08-04 — End: 2023-08-04

## 2023-08-04 MED ORDER — ONDANSETRON HCL (PF) 4 MG/2 ML INJECTION SOLUTION
4.0000 mg | Freq: Once | INTRAMUSCULAR | Status: DC | PRN
Start: 2023-08-04 — End: 2023-08-04

## 2023-08-04 MED ORDER — EPHEDRINE SULFATE 50 MG/ML INTRAVENOUS SOLUTION
Freq: Once | INTRAVENOUS | Status: DC | PRN
Start: 2023-08-04 — End: 2023-08-04
  Administered 2023-08-04: 15 mg via INTRAVENOUS

## 2023-08-04 SURGICAL SUPPLY — 40 items
ADH LIQUID LF  WTPRF VIAL PREP NONSTAIN MASTISOL STYRAX GUM MASTIC ALC MTHY SLCYT STRL CLR NHZR 2/3 (WOUND CARE SUPPLY) ×1 IMPLANT
BLADE 11 2 END CBNSTL SURG STRL DISP (SURGICAL CUTTING SUPPLIES) ×1 IMPLANT
CANNULA LAPSCP 5MM 100MM VERSAONE STD UNIV FIX BLDLS DLPHN NOSE TIP LOW PROF STRL LF  DISP (ENDOSCOPIC SUPPLIES) ×3 IMPLANT
CATH CHOLANG REDDICK 4FR SCP TIP STRL LTX DISP (ENDOSCOPIC SUPPLIES) ×1 IMPLANT
CLEANER INSTRUMENT PRE-KLENZ 13.5 OZ (MISCELLANEOUS PT CARE ITEMS) ×1 IMPLANT
CLIP MED LRG INTERNAL WECK HEMOLOK PLMR LGT NONAB CART STRL DISP LF  GRN (SUTURE AIDS) ×3 IMPLANT
CONTAINR HISTO C90ML 10% NEUT BF FRMLN POLYPROP PREFL 60ML (SPECIMEN COLLECTION SUPPLIES) ×1 IMPLANT
COUNTER 20 CNT BLOCK ADH NEEDLE STRL LF  RD SHARP FOAM 15.75X11.5X14IN DISP (MED SURG SUPPLIES) ×1 IMPLANT
COVER 53X24IN MAYOSTAND PRXM STRL DISP EQP SMS LF (DRAPE/PACKS/SHEETS/OR TOWEL) ×1 IMPLANT
COVER EQP 90X60IN HVDTY BCK PAD FNFLD BLU STRL (DRAPE/PACKS/SHEETS/OR TOWEL) ×2 IMPLANT
DEVICE SPEC RETR INZII 10MM GUIDE BEAD STD ENDOS 225ML LF (ENDOSCOPIC SUPPLIES) ×1 IMPLANT
DRAPE 76X55IN 3 QT HALYARD LF  STRL DISP SURG (DRAPE/PACKS/SHEETS/OR TOWEL) ×1 IMPLANT
DRAPE SURG KC100 LAP CHOLE REINF ARMBRD CVR HKLP TUBE HLDR FEN 104X76X120IN STD BSC STRL (DRAPE/PACKS/SHEETS/OR TOWEL) ×1 IMPLANT
DRESS TRNSPR 4.75X4IN POLYUR ADH HYPOALL WTPRF TGDRM STD STRL LF (WOUND CARE SUPPLY) ×1 IMPLANT
GLOVE SURG 6.5 LF  PF BEAD CUF STRL CRM 11.3IN PROTEXIS PI PLISPRN THK9.1 MIL (GLOVES AND ACCESSORIES) ×1 IMPLANT
GLOVE SURG 6.5 LF  PF SMOOTH BEAD CUF INTLK STRL BLU 11.3IN PROTEXIS NEU-THERA PLISPRN THK7.9 MIL (GLOVES AND ACCESSORIES) ×1 IMPLANT
GLOVE SURG 7 LF  PF BEAD CUF STRL CRM 11.8IN PROTEXIS PI PLISPRN THK9.1 MIL (GLOVES AND ACCESSORIES) ×4 IMPLANT
GLOVE SURG 7 LF  PF SMOOTH BEAD CUF INTLK STRL BLU 11.8IN PROTEXIS NEU-THERA PLISPRN THK7.9 MIL (GLOVES AND ACCESSORIES) ×1 IMPLANT
GLOVE SURG 7.5 LF  PF BEAD CUF STRL CRM 11.8IN PROTEXIS PI PLISPRN THK9.1 MIL (GLOVES AND ACCESSORIES) ×2 IMPLANT
GLOVE SURG 8 LF  PF SMOOTH BEAD CUF INTLK STRL BLU 11.8IN PROTEXIS NEU-THERA PLISPRN THK7.9 MIL (GLOVES AND ACCESSORIES) ×1 IMPLANT
GOWN SURG LRG STD LGTH L3 HKLP CLSR RGLN SLEEVE TWL STRL LF  DISP GRN AERO BLU PRFRM FBRC (DRAPE/PACKS/SHEETS/OR TOWEL) ×2 IMPLANT
GOWN SURG XL STD LGTH L3 HKLP CLSR RGLN SLEEVE TWL STRL LF  DISP GRN AERO BLU PRFRM FBRC (DRAPE/PACKS/SHEETS/OR TOWEL) ×2 IMPLANT
IRR SUCT 10FT STRKFL TUBE 2 SPIKE STRL LF  DISP (ENDOSCOPIC SUPPLIES) ×1 IMPLANT
LABEL MED CORRECT MED LABELING SYS 4 FLG 2 SHEET 24 PRPRNT STRL (MED SURG SUPPLIES) ×1 IMPLANT
NEEDLE HYPO  23GA 1.5IN TW PRCSNGL SS POLYPROP REG BVL LL HUB DEHP-FR TRQS STRL LF  DISP (MED SURG SUPPLIES) ×2 IMPLANT
NEEDLE INSFL 120MM 14GA STD SRGNDL PNMPRTN SPRG LD BLUNT STY STRL LF  DISP SS PLASTIC RD (ENDOSCOPIC SUPPLIES) ×1 IMPLANT
OMNIPAQUE POLYMER 50ML 350MG BOTTLE 50 ML (MEDICATIONS/SOLUTIONS) ×1 IMPLANT
SET TUBING PNEUMOCLEAR HIFLO SMOKE EVAC (MED SURG SUPPLIES) ×1 IMPLANT
SOL ANFG DFGR ISOPRPNL PAD OVAL BTL NABRSV ADH STRL LF  DISP (ENDOSCOPIC SUPPLIES) ×1 IMPLANT
SOL IV LR 1000ML PRSV FR FLXB CONTAINR LF (MEDICATIONS/SOLUTIONS) ×1 IMPLANT
SPONGE GAUZE 4X4IN AV GZ CLU COTTON ABS NWVN POSTOP LF  STRL DISP (WOUND CARE SUPPLY) ×1 IMPLANT
SPONGE GAUZE 4X4IN MDCHC COTTON 12 PLY TY 7 LF  STRL DISP (WOUND CARE SUPPLY) ×1 IMPLANT
SPONGE SURG 4X4IN 16 PLY XRY DETECT COTTON STRL LF  DISP (WOUND CARE SUPPLY) ×1 IMPLANT
STRIP 4X.5IN STRSTRP PLSTR REINF SKNCLS WHT STRL LF (WOUND CARE SUPPLY) ×1 IMPLANT
SUTURE 0 CT2 VICRYL+ 27IN VIOL BRD ANBCTRL COAT ABS (SUTURE/WOUND CLOSURE) ×1 IMPLANT
SUTURE 5-0 C-13 POLYSRB 30IN UNDYED BRD COAT ABS (SUTURE/WOUND CLOSURE) ×2 IMPLANT
SYRINGE LL 10ML LF  STRL GRAD N-PYRG DEHP-FR PVC FREE MED DISP (MED SURG SUPPLIES) ×4 IMPLANT
TOWEL 24X16IN COTTON BLU DISP SURG STRL LF (DRAPE/PACKS/SHEETS/OR TOWEL) ×2 IMPLANT
TROCAR LAPSCP STD 100MM 11MM VERSAONE FIX CANN BLDLS DLPHN NOSE TIP OPTC STRL LF  DISP (ENDOSCOPIC SUPPLIES) ×1 IMPLANT
TROCAR LAPSCP STD 100MM 5MM VERSAONE FIX CANN BLDLS DLPHN NOSE TIP OPTC STRL LF  DISP (ENDOSCOPIC SUPPLIES) ×1 IMPLANT

## 2023-08-04 NOTE — Anesthesia Preprocedure Evaluation (Signed)
 ANESTHESIA PRE-OP EVALUATION  Planned Procedure: LAPAROSCOPIC CHOLECYSTECTOMY USING CHOLANGIOGRAM WITH FLUOROSCOPY; POSSIBLE OPEN (Abdomen)  Review of Systems                   Pulmonary     Cardiovascular    Hyperlipidemia ,No peripheral edema,        GI/Hepatic/Renal    GERD        Endo/Other    hypothyroidism,   type 2 diabetes    Neuro/Psych/MS        Cancer                  Physical Assessment      Airway       Mallampati: II    TM distance: >3 FB    Neck ROM: full  Mouth Opening: good.            Dental       Dentition intact             Pulmonary    Breath sounds clear to auscultation  (-) no rhonchi, no decreased breath sounds, no wheezes, no rales and no stridor     Cardiovascular    Rhythm: regular  Rate: Normal  (-) no friction rub, carotid bruit is not present, no peripheral edema and no murmur     Other findings          Plan  ASA 2     Planned anesthesia type: general     general anesthesia with endotracheal tube intubation      PONV Plan:  I plan to administer pharmcologic prophalaxis antiemetics              Intravenous induction     Anesthesia issues/risks discussed are: Dental Injuries, Post-op Pain Management, Aspiration, Sore Throat and PONV.  Anesthetic plan and risks discussed with patient  signed consent obtained          Patient's NPO status is appropriate for Anesthesia.

## 2023-08-04 NOTE — Anesthesia Postprocedure Evaluation (Signed)
 Anesthesia Post Op Evaluation    Patient: Whitney Vang  Procedure(s):  LAPAROSCOPIC CHOLECYSTECTOMY USING CHOLANGIOGRAM WITH FLUOROSCOPY    Last Vitals:Temperature: 36.6 C (97.8 F) (08/04/23 1222)  Heart Rate: 82 (08/04/23 1222)  BP (Non-Invasive): 136/76 (08/04/23 1222)  Respiratory Rate: 17 (08/04/23 1222)  SpO2: 100 % (08/04/23 1222)    No notable events documented.    Patient is sufficiently recovered from the effects of anesthesia to participate in the evaluation and has returned to their pre-procedure level.  Patient location during evaluation: PACU       Patient participation: complete - patient participated  Level of consciousness: awake and alert and responsive to verbal stimuli    Pain management: adequate  Airway patency: patent    Anesthetic complications: no  Cardiovascular status: acceptable  Respiratory status: acceptable  Hydration status: acceptable  Patient post-procedure temperature: Pt Normothermic   PONV Status: Absent

## 2023-08-04 NOTE — Nurses Notes (Signed)
 PATIENT TRANSFERRED TO DS 25  . IV & SURGICAL SITES C/D/I, REVIEWED WITH NURSE BETH AT BEDSIDE HANDOFF. VS POST BED TRANSFER: HR 76, RR 14, TEMP 97.43F, BP 144/90, SPO2 99% RA. PATIENT A&O X 4.

## 2023-08-04 NOTE — OR Surgeon (Signed)
 [facility]      Patient Name: Whitney Vang, Whitney Vang Number: [id number]  Date of Service: 08/04/2023   Date of Birth: 1951/01/22      Pre-Operative Diagnosis: Gallstones     Post-Operative Diagnosis: Gallstones    Procedure(s)/Description:  LAPAROSCOPIC CHOLECYSTECTOMY USING CHOLANGIOGRAM WITH FLUOROSCOPY: 54098 (CPT)       Attending Surgeon: Eliot Guernsey, MD     Assistant Surgeon: na    Anesthesia:  Anesthesiologist: Shawnie Delton, DO  CRNA: Vear Georgi, CRNA    Anesthesia Type: .General     First Assist: None     Estimated Blood Loss:  None    Risks, benefits, indications, and complications of laparoscopic possible open cholecystectomy were discussed in detail including but not limited to conversion to open procedure, common bile duct injury, bleeding, retained stone, postoperative bile leak, postoperative hernia, postoperative diarrhea, reaction to anesthesia, injury to associated or surrounding organs, wound infection, and remote possibility of death.  Also discussed the possibility of lack of resolution of all preoperative symptoms from the procedure.  The patient was given ample opportunity to ask questions and all questions were answered to their satisfaction. The patient voices understanding of the procedure and wishes to proceed with surgery.  Informed consent clearly obtained.    The patient was brought into the operating room and placed on the table in the supine position. After general endotracheal anesthesia was provided, the abdomen was prepped and draped in a sterile manner. Local anesthetic was infiltrated into the infraumbilical region, and an incision was then made in the infraumbilical area. The umbilical ligament was grasped with a Kocher clamp, and the Veress needle was inserted into the abdomen without any difficulty. The CO2 was connected, and the abdomen was insufflated with CO2. After adequate insufflation, a 10 mm non bladed  trocar was inserted without any  difficulty.The laparoscope was inserted into the infraumbilical port and general examination of the abdomen was performed. The subxiphoid trocar was then inserted after infiltrating the skin and fascia with local anesthetic. A non bladed 5 mm trocar was then inserted under direct visualization without any difficulty. Two 5 mm trocars were then inserted, one in the right upper abdomen half-way between the costal arch and the umbilicus along the mid clavicular line. The second trocar was inserted on the right lateral abdomen along the anterior axillary line under direct visualization. Local anesthetic was infiltrated at the port sites for post operative analgesia. No difficulty in inserting these trocars was encountered. Locking grasping forceps were then placed through each of these 5 mm trocars. The more lateral forceps was used to grasp the fundus of the gallbladder and the more medial forceps was used to grasp Hartmann's pouch. Careful dissection was then carried out to remove the adjacent omentum and adipose tissue from the gallbladder. Identification of the cystic artery and cystic duct was performed, and then careful dissection to expose the cystic duct and cystic artery was performed. The Critical View was visualized.  After an adequate length of the cystic duct and cystic artery were exposed, an Hem-o-Lok clip was placed on the cystic duct proximal to the gallbladder. A Hem-o-lok clips was also placed on the cystic artery proximal to the gallbladder, and two Hem-o-lok clips were placed on the cystic artery distal to the gallbladder. The cystic artery was transected without any difficulty. An opening in the cystic duct was then made, and cholangiogram catheter was inserted into the cystic duct. Intraoperative cholangiogram was then performed.  After the cholangiogram was performed, the cystic duct distal to the gallbladder was clipped with two Hem-o-lok clips. The cystic duct was then transected. The  gallbladder was then carefully dissected from the liver bed using spatula cautery. Examination of the liver bed as dissection proceeded showed no evidence of any bleeding, and hemostasis throughout the entire procedure was well maintained. The gallbladder was subsequently removed from the liver bed and then removed from the abdominal cavity through the infraumbilical site. The subphrenic and subhepatic spaces were irrigated with irrigating solution and suctioned dry. No evidence of any bleeding was encountered. Each of the trocar sites were then examined as the trocars were removed from the abdominal wall. The infraumbilical site was closed with 0 Vicryl suture to close the fascia. Each incision was then irrigated with saline solution, dried. Each incision was then closed with 5-0 Vicryl suture in a subcuticular fashion. The areas were cleaned and dried. Steri-strips were applied over the incisions and the vacuum sealed op site dressing was applied on the umbilical incision site. The patient was then extubated and taken from the operating room to the recovery room in stable condition.  Intra-operative cholangiography was performed in order to fully evaluate the biliary system, its anatomy and to identify the presence of common duct stones. Under real-time fluoroscopy, the contrast was injected via the cholangiogram catheter. The contrast outlined the cystic duct along with the biliary structures and showed good flow of contrast into the duodenum with no evidence of filling defects.  Natalynn Pedone B. Kyrian Stage, MD, MBA, FACS  [facility] -General Surgery

## 2023-08-04 NOTE — Anesthesia Transfer of Care (Signed)
 ANESTHESIA TRANSFER OF CARE   Whitney Vang is a 73 y.o. ,female, Weight: 78.5 kg (173 lb)   had Procedure(s):  LAPAROSCOPIC CHOLECYSTECTOMY USING CHOLANGIOGRAM WITH FLUOROSCOPY  performed  08/04/23   Primary Service: Eliot Guernsey, MD    Past Medical History:   Diagnosis Date   . Constipation    . Does use hearing aid    . Esophageal reflux    . H/O hearing loss    . Hyperlipidemia    . Hypothyroidism    . Prediabetes    . Spinal stenosis     [facility]   . Spinal stenosis 09/29/2021    I was diagnosed in the fall.  I lost some of the use of my left arm.  I went to [facility].  Therapy in [place] lasted from October 2023 to January 2024.     . Type 2 diabetes mellitus       Allergy History as of 08/04/23        No Known Allergies                  I completed my transfer of care / handoff to the receiving personnel during which we discussed:  Access, Airway, All key/critical aspects of case discussed, Analgesia, Antibiotics, Expectation of post procedure, Fluids/Product, Gave opportunity for questions and acknowledgement of understanding, Labs and PMHx  Report given to: Chattin, Reegan, RN    Post Location: PACU                                                           Last OR Temp: Temperature: 36.6 C (97.8 F)  ABG:  POTASSIUM   Date Value Ref Range Status   07/26/2023 4.0 3.5 - 5.1 mmol/L Final     KETONES   Date Value Ref Range Status   06/30/2023 Negative Negative, Trace mg/dL Final     CALCIUM   Date Value Ref Range Status   07/26/2023 9.5 8.6 - 10.3 mg/dL Final     Calculated P Axis   Date Value Ref Range Status   07/26/2023 63 degrees Final     Calculated R Axis   Date Value Ref Range Status   07/26/2023 80 degrees Final     Calculated T Axis   Date Value Ref Range Status   07/26/2023 17 degrees Final     Airway:* No LDAs found *  Blood pressure 136/76, pulse 82, temperature 36.6 C (97.8 F), resp. rate 17, height 1.575 m (5' 2), weight 78.5 kg (173 lb), SpO2 100%.

## 2023-08-04 NOTE — H&P (Signed)
 [facility]  General Surgery  History and Physical    Date of Service:  08/04/2023  Whitney Vang, Whitney Vang, 73 y.o. female  Date of Admission:  08/04/2023  Date of Birth:  1950-08-02  PCP: Rebbeca Campi, MD    Reason for admission:  Laparoscopic cholecystectomy with cholangiogram    HPI:  Whitney Vang is a 73 y.o. White female who is admitted for Gallstones [K80.20]     Ms. Novick presents today for laparoscopic cholecystectomy with cholangiogram due to symptomatic cholelithiasis. The patient had been experiencing RUQ discomfort radiating to the back. Ultrasound of the gallbladder showed gallstones.     Positive DM  Negative blood thinner        Review of the result(s) of each unique test:  Patient underwent diagnostic testing ( as above ) prior to this dates visit.  I have personally reviewed the results and that serves as a component of the medical decision making for this encounter        Review of prior external note(s) from each unique source:  Patients referral to this office including a recent assessment by the referring provider.  This was reviewed by me for this unique office visit for the indication and intent of the referral as well as any pertinent medical or surgical history relevant to the patients independent evaluation by me today.    Past Medical History:   Diagnosis Date    Constipation     Does use hearing aid     Esophageal reflux     H/O hearing loss     Hyperlipidemia     Hypothyroidism     Prediabetes     Spinal stenosis     [facility]    Spinal stenosis 09/29/2021    I was diagnosed in the fall.  I lost some of the use of my left arm.  I went to [facility].  Therapy in [place] lasted from October 2023 to January 2024.      Type 2 diabetes mellitus       Past Surgical History:   Procedure Laterality Date    HX WISDOM TEETH EXTRACTION        Social History[1]    Family Medical History:       Problem Relation (Age of Onset)    Bone cancer Mother    Breast Cancer Mother     Diabetes Brother    Diabetes type II Mother, Brother, Brother, Brother, Brother    Heart Disease Mother, Father, Brother, Brother, Brother, Brother    Hypertension (High Blood Pressure) Sister    Irregular heart beat Sister    No Known Problems Maternal Aunt, Maternal Uncle, Paternal Aunt, Paternal Uncle, Maternal Grandmother, Maternal Grandfather, Paternal Grandmother, Paternal Grandfather, Daughter, Son, Other    Pacemaker Brother           Medications Prior to Admission       Prescriptions    atorvastatin  (LIPITOR) 20 mg Oral Tablet    Take 1 Tablet (20 mg total) by mouth Once a day    cholecalciferol, vitamin D3, 25 mcg (1,000 unit) Oral Tablet    Take 1 Tablet (1,000 Units total) by mouth Daily    ciprofloxacin -dexAMETHasone  (CIPRODEX ) 0.3-0.1 % Otic Drops, Suspension    Administer 4 Drops into the left ear Twice daily Instill 4 drops into Left Ear twice daily    Patient not taking:  Reported on 08/04/2023    fluticasone  propionate (FLONASE ) 50 mcg/actuation Nasal Spray, Suspension  Administer 1 Spray into each nostril Daily    levothyroxine (SYNTHROID) 50 mcg Oral Tablet    Take 1 Tablet (50 mcg total) by mouth Every morning    metFORMIN (GLUCOPHAGE XR) 500 mg Oral Tablet Sustained Release 24 hr    Take 1 Tablet (500 mg total) by mouth Daily    multivit-min-iron -FA-vit K-lut (CENTRUM SILVER  WOMEN) 8 mg iron -400 mcg-50 mcg Oral Tablet    Take 1 Tablet by mouth Once a day           Allergies[2]       Patient Vitals for the past 24 hrs:   BP Temp Pulse Resp SpO2 Height Weight   08/04/23 0900 138/74 36.4 C (97.6 F) 74 16 98 % -- --   08/03/23 0933 -- -- -- -- -- 1.575 m (5' 2) 78.5 kg (173 lb)          General: appropriate for age. in no acute distress.    Vital signs are present above and have been reviewed by me     HEENT: Atraumatic, Normocephalic. PERRLA, EOMI. Nose clear. Throat clear.    Lungs: Nonlabored breathing with symmetric expansion.  Clear to auscultation bilaterally    Heart:Regular wth respect  to rate and rythmn.    Abdomen:Soft. Slightly tender to deep palpation in the RUQ. Nondistended and otherwise benign       Extremities:  Grossly normal with good range of motion and no major deformities.    Neuro:  Grossly normal motor and sensory function. CN's II through XII intact.    Psychiatric: Alert and oriented to person, place, and time. affect appropriate    Laboratory Data:     Results for orders placed or performed during the hospital encounter of 08/04/23 (from the past 24 hours)   POC BLOOD GLUCOSE (RESULTS)   Result Value Ref Range    GLUCOSE, POC 110 (H) 70 - 100 mg/dl       Imaging Studies:    FLUORO CHOLANGIOGRAM OPERATIVE    (Results Pending)        Assessment/Plan:  Gallstones [K80.20]    Laparoscopic cholecystectomy with cholangiogram scheduled for  08/04/23     Risks, benefits, indications, and complications of laparoscopic possible open cholecystectomy were discussed in detail including but not limited to conversion to open procedure, common bile duct injury, bleeding, retained stone, postoperative bile leak, postoperative hernia, postoperative diarrhea, reaction to anesthesia, injury to associated or surrounding organs, wound infection, and remote possibility of death.  Also discussed the possibility of lack of resolution of all preoperative symptoms from the procedure.  The patient was given ample opportunity to ask questions and all questions were answered to their satisfaction. The patient voices understanding of the procedure and wishes to proceed with surgery.  Informed consent clearly obtained.    This note was partially created using voice recognition software and is inherently subject to errors including those of syntax and sound alike  substitutions which may escape proof reading. In such instances, original meaning may be extrapolated by contextual derivation.    Dreanna Kyllo B Khizar Fiorella, MD, MBA, FACS         [1]   Social History  Tobacco Use    Smoking status: Never    Smokeless tobacco:  Never   Vaping Use    Vaping status: Never Used   Substance Use Topics    Alcohol use: Not Currently    Drug use: Not Currently   [2] No Known Allergies

## 2023-08-04 NOTE — Discharge Instructions (Addendum)
 Follow up with Dr Cathay Clonts on August 22, 2023 at 1:30 pm.    Do not remove steri strips.  They will come off on their own.    Leave gauze dressing to umbilicus on for 5 days and then remove. Wash with soap and water, pat dry, apply ointment and cover with gauze    May start Daily showers tomorrow evening.     No heavy lifting or straining greater than 20 pounds.    Gas X over the counter for gas discomfort    Call for problems, questions, concerns. Such as increased pain, foul smelling drainage or a fever of 100.4 or greater    Follow a low fat diet for one month, then introduce fatty foods one at a time.     Resume home meds.    Rx for home sent in to your pharmacy.

## 2023-08-05 ENCOUNTER — Telehealth (INDEPENDENT_AMBULATORY_CARE_PROVIDER_SITE_OTHER): Payer: Self-pay | Admitting: Surgery

## 2023-08-05 DIAGNOSIS — K8020 Calculus of gallbladder without cholecystitis without obstruction: Secondary | ICD-10-CM

## 2023-08-05 LAB — SURGICAL PATHOLOGY SPECIMEN

## 2023-08-12 ENCOUNTER — Encounter (INDEPENDENT_AMBULATORY_CARE_PROVIDER_SITE_OTHER): Payer: Self-pay | Admitting: Internal Medicine

## 2023-08-12 ENCOUNTER — Other Ambulatory Visit (INDEPENDENT_AMBULATORY_CARE_PROVIDER_SITE_OTHER): Payer: Self-pay

## 2023-08-22 ENCOUNTER — Encounter (INDEPENDENT_AMBULATORY_CARE_PROVIDER_SITE_OTHER): Payer: Self-pay | Admitting: Surgery

## 2023-08-22 ENCOUNTER — Other Ambulatory Visit: Payer: Self-pay

## 2023-08-22 ENCOUNTER — Ambulatory Visit (INDEPENDENT_AMBULATORY_CARE_PROVIDER_SITE_OTHER): Payer: Self-pay | Admitting: Surgery

## 2023-08-22 VITALS — BP 152/81 | HR 88 | Temp 96.6°F | Resp 18 | Ht 62.0 in | Wt 173.0 lb

## 2023-08-22 DIAGNOSIS — Z48815 Encounter for surgical aftercare following surgery on the digestive system: Secondary | ICD-10-CM

## 2023-08-22 DIAGNOSIS — Z9889 Other specified postprocedural states: Secondary | ICD-10-CM

## 2023-08-22 NOTE — Progress Notes (Signed)
 GENERAL SURGERY, [facility]AL [facility]GERY  [address]  [place] [address]       Name: Whitney Vang MRN:  [id number]   Date: 08/22/2023 Age: 73 y.o. 01/12/1951      PCP: Calhoun LITTIE Na, MD     Subjective  Whitney Vang is a 73 y.o. year old female who presents for follow up of laparoscopic cholecystectomy with cholangiogram on 08/04/2023.  The patient is pleased with their postoperative progress at this point.  They have had appropriate incisional discomfort but well controlled with medications.  The patient is eating well and using the bathroom without any difficulty. Their activity is improving as well.  Other symptoms or complaints include none  Pathology results are consistent with gallstones    Patient Active Problem List    Diagnosis Date Noted    Cholelithiasis 07/04/2023     05/25 ER dx & referreal to [facility]      Thyroid nodule, uninodular 05/19/2023     CRoV- Mildly enlarged Rt lobe with newly noted 6mm nodule that was not present in 2023.  TIRADS 4 FNA suggested.  Ordered by [facility]      Health care maintenance 03/28/2023     DEXA 10/24-[facility]  Mammo- 04/24- [facility]  Colon 2023 for 10 yr f/u  Cologuard neg 05/23      Type 2 diabetes mellitus without complications 03/28/2023     Noted by VINSON MENDS MD  last documented on [id number]      Hyperlipidemia 12/29/2022     Atorvastatin  20 mg started 10/24  LDL 55      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     10/24:  Lumbar spine: The composite T score for the lumbar spine between the L1 and L4 vertebrae measures -0.6. This is within the limits of normal.           Proximal left femur: The composite T score for the proximal left femur measures 0.0 which is within the limits of normal. The T score for the left femoral neck measured -1.2. This is consistent with osteopenia.     Proximal right femur: A composite T score for the proximal right femur measured 0.0 which is within the limits of normal. The T score of the right femoral neck measured  -1.6. This is consistent with osteopenia.      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     We reviewed the surgical details and postoperative expectations typical of a C3-7 ACDF. We reviewed all prior imaging relevant to the clinical course, including cervical MRI demonstrating moderate stenosis at C3-4. There is bilateral foraminal stenosis at C4-5, left-sided C5-6 and C6-7 foraminal stenosis. There is severe central stenosis at C4-5. And conservative ordered multiple cervical interventions including a circumferential surgery, posterior only surgery or an anterior only surgery.. It is possible that her left-sided weakness is simply from a shoulder injury. We will await the results of the shoulder MR       FH: breast cancer in first degree relative 09/22/2022     Mother age 65s      Prediabetes 09/22/2022     A1C 6.2% 2023      Hypothyroidism 09/22/2022     TPO > 600 2023          Current Outpatient Medications   Medication Sig    atorvastatin  (LIPITOR) 20 mg Oral Tablet Take 1 Tablet (20 mg total) by mouth Once a day    cholecalciferol, vitamin  D3, 25 mcg (1,000 unit) Oral Tablet Take 1 Tablet (1,000 Units total) by mouth Daily    ciprofloxacin -dexAMETHasone  (CIPRODEX ) 0.3-0.1 % Otic Drops, Suspension Administer 4 Drops into the left ear Twice daily Instill 4 drops into Left Ear twice daily    fluticasone  propionate (FLONASE ) 50 mcg/actuation Nasal Spray, Suspension Administer 1 Spray into each nostril Daily    HYDROcodone -acetaminophen  (NORCO) 7.5-325 mg Oral Tablet Take 1 Tablet by mouth Every 6 hours as needed for Pain    levothyroxine (SYNTHROID) 50 mcg Oral Tablet Take 1 Tablet (50 mcg total) by mouth Every morning    metFORMIN (GLUCOPHAGE XR) 500 mg Oral Tablet Sustained Release 24 hr Take 1 Tablet (500 mg total) by mouth Daily    multivit-min-iron -FA-vit K-lut (CENTRUM SILVER  WOMEN) 8 mg iron -400 mcg-50 mcg Oral Tablet Take 1 Tablet by mouth Once a day    ondansetron  (ZOFRAN  ODT) 4 mg Oral Tablet, Rapid  Dissolve Take 1 Tablet (4 mg total) by mouth Every 8 hours as needed for Nausea/Vomiting          Objective: BP (!) 152/81   Pulse 88   Temp (!) 35.9 C (96.6 F)   Resp 18   Ht 1.575 m (5' 2)   Wt 78.5 kg (173 lb)   SpO2 98%   BMI 31.64 kg/m            General:appropriate for age. in no acute distress.    Vital signs are present above and have been reviewed by me     Head and Neck: within normal limitis  Lungs: CTA   CV: RRR  Abdomen:  Soft and nontender.  Incisions well healed  Extremities: grossly normal  Neuro: normal motor and sensory function    Assessment/Plan  Doing well status post laparoscopic cholecystectomy with cholangiogram    Preoperative symptoms have resolved.  The patient was instructed to resume usual activities and diet as tolerated. The patient was pleased with the results and they were very appreciative of the care provided.  I would be happy to see them again at any time.  Opportunity was given for questions, and none were voiced past the above discussion.  The patient is free to return at any time for further questions and or difficulties.    Lucresha Dismuke B Ayomide Purdy, MD,MBA,FACS

## 2023-08-30 ENCOUNTER — Encounter (INDEPENDENT_AMBULATORY_CARE_PROVIDER_SITE_OTHER): Payer: Self-pay | Admitting: OTOLARYNGOLOGY

## 2023-08-30 ENCOUNTER — Other Ambulatory Visit: Payer: Self-pay

## 2023-08-30 ENCOUNTER — Ambulatory Visit: Payer: Self-pay | Attending: OTOLARYNGOLOGY | Admitting: OTOLARYNGOLOGY

## 2023-08-30 VITALS — Ht 62.0 in | Wt 173.0 lb

## 2023-08-30 DIAGNOSIS — H903 Sensorineural hearing loss, bilateral: Secondary | ICD-10-CM

## 2023-08-30 DIAGNOSIS — H6993 Unspecified Eustachian tube disorder, bilateral: Secondary | ICD-10-CM | POA: Insufficient documentation

## 2023-08-30 DIAGNOSIS — H905 Unspecified sensorineural hearing loss: Secondary | ICD-10-CM | POA: Insufficient documentation

## 2023-08-30 DIAGNOSIS — H9202 Otalgia, left ear: Secondary | ICD-10-CM | POA: Insufficient documentation

## 2023-08-30 DIAGNOSIS — E041 Nontoxic single thyroid nodule: Secondary | ICD-10-CM | POA: Insufficient documentation

## 2023-08-30 NOTE — H&P (Signed)
 ENT, [facility]  [address]  [place] [address]  Operated by [facility]  Return Patient Visit    Name: Whitney Vang MRN:  [id number]   Date: 08/30/2023 DOB: 1950/11/17 (72 y.o.)       Referring Provider:  Layman Calhoun CROME, MD    Reason for Visit:   Chief Complaint   Patient presents with    Follow-up After Testing     Rc after audio       History of Present Illness:  Whitney Vang is a 73 y.o. female who is FU on ears. Pt states her left ear pain comes and goes. Used Ciprodex  with little change.     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, Type A  Patient History:  Problem List[1]    Current Outpatient Medications   Medication Sig    atorvastatin  (LIPITOR) 20 mg Oral Tablet Take 1 Tablet (20 mg total) by mouth Once a day    cholecalciferol, vitamin D3, 25 mcg (1,000 unit) Oral Tablet Take 1 Tablet (1,000 Units total) by mouth Daily    ciprofloxacin -dexAMETHasone  (CIPRODEX ) 0.3-0.1 % Otic Drops, Suspension Administer 4 Drops into the left ear Twice daily Instill 4 drops into Left Ear twice daily    fluticasone  propionate (FLONASE ) 50 mcg/actuation Nasal Spray, Suspension Administer 1 Spray into each nostril Daily    HYDROcodone -acetaminophen  (NORCO) 7.5-325 mg Oral Tablet Take 1 Tablet by mouth Every 6 hours as needed for Pain    levothyroxine (SYNTHROID) 50 mcg Oral Tablet Take 1 Tablet (50 mcg total) by mouth Every morning    metFORMIN (GLUCOPHAGE XR) 500 mg Oral Tablet Sustained Release 24 hr Take 1 Tablet (500 mg total) by mouth Daily    multivit-min-iron -FA-vit K-lut (CENTRUM SILVER  WOMEN) 8 mg iron -400 mcg-50 mcg Oral Tablet Take 1 Tablet by mouth Once a day    ondansetron  (ZOFRAN  ODT) 4 mg Oral Tablet, Rapid Dissolve Take 1 Tablet (4 mg total) by mouth Every 8 hours as needed for Nausea/Vomiting      Allergies[2]  Past Medical History:   Diagnosis Date    Constipation     Does use hearing aid     Esophageal reflux     H/O hearing loss     Hyperlipidemia      Hypothyroidism     Prediabetes     Spinal stenosis     [facility]    Spinal stenosis 09/29/2021    I was diagnosed in the fall.  I lost some of the use of my left arm.  I went to [facility].  Therapy in [place] lasted from October 2023 to January 2024.      Type 2 diabetes mellitus       Past Surgical History:   Procedure Laterality Date    HX WISDOM TEETH EXTRACTION        Family Medical History:       Problem Relation (Age of Onset)    Bone cancer Mother    Breast Cancer Mother    Diabetes Brother    Diabetes type II Mother, Brother, Brother, Brother, Brother    Heart Disease Mother, Father, Brother, Brother, Brother, Brother    Hypertension (High Blood Pressure) Sister    Irregular heart beat Sister    No Known Problems Maternal Aunt, Maternal Uncle, Paternal Aunt, Paternal Uncle, Maternal Grandmother, Maternal Grandfather, Paternal Grandmother, Paternal Grandfather, Daughter, Son, Other    Pacemaker Brother  Social History[3]    Review of Systems:  Review of Systems    Physical Exam:  Ht 1.575 m (5' 2)   Wt 78.5 kg (173 lb)   BMI 31.64 kg/m       ENT Physical Exam  Constitutional  Appearance: patient appears well-developed, well-nourished and well-groomed,  Communication/Voice: communication appropriate for developmental age; vocal quality normal;  Head and Face  Appearance: head appears normal, face appears normal and face appears atraumatic;  Palpation: facial palpation normal;  Salivary: glands normal;  Ear  Hearing: intact;  Auricles: right auricle normal; left auricle normal;  External Mastoids: right external mastoid normal; left external mastoid normal;  Ear Canals: right ear canal normal; left ear canal normal;  Tympanic Membranes: right tympanic membrane normal; left tympanic membrane normal;  Nose  External Nose: nares patent bilaterally; external nose normal;  Internal Nose: nasal mucosa normal; septum normal; bilateral inferior turbinates normal;  Oral Cavity/Oropharynx  Lips:  normal;  Teeth: normal;  Gums: gingiva normal;  Tongue: normal;  Oral mucosa: normal;  Hard palate: normal;  Neck  Neck: neck normal; neck palpation normal;  Thyroid: thyroid normal;  Respiratory  Inspection: breathing unlabored; normal breathing rate;  Lymphatic  Palpation: lymph nodes normal;  Neurovestibular  Mental Status: alert and oriented;  Psychiatric: mood normal; affect is appropriate;  Cranial Nerves: cranial nerves intact;         Assessment:  ENCOUNTER DIAGNOSES     ICD-10-CM   1. Ear pain, left  H92.02   2. Thyroid nodule  E04.1   3. ETD (Eustachian tube dysfunction), bilateral  H69.93   4. 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  Interpreted and reviewed audiogram results. Thyroid US  in 5 months. Cont Flonase . Patient wishes to think about MRI.     Orders Placed This Encounter    CANCELED: US  THYROID    US  THYROID      Return for FOllow up after US .    Barnie SHAUNNA Alvine, PA-C  The advanced practice clinician's documentation was reviewed/amended in its entirety with the assessment and plan portion completely performed independently by me during this separate encounter.          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

## 2023-09-28 ENCOUNTER — Other Ambulatory Visit (INDEPENDENT_AMBULATORY_CARE_PROVIDER_SITE_OTHER): Payer: Self-pay | Admitting: Internal Medicine

## 2023-09-28 ENCOUNTER — Ambulatory Visit (INDEPENDENT_AMBULATORY_CARE_PROVIDER_SITE_OTHER): Payer: Self-pay | Admitting: Internal Medicine

## 2023-09-28 ENCOUNTER — Ambulatory Visit: Payer: Medicare Other | Attending: Family Medicine | Admitting: Internal Medicine

## 2023-09-28 ENCOUNTER — Encounter (INDEPENDENT_AMBULATORY_CARE_PROVIDER_SITE_OTHER): Payer: Self-pay | Admitting: Internal Medicine

## 2023-09-28 ENCOUNTER — Other Ambulatory Visit: Payer: Self-pay

## 2023-09-28 VITALS — BP 131/77 | HR 72 | Ht 62.0 in

## 2023-09-28 DIAGNOSIS — M85859 Other specified disorders of bone density and structure, unspecified thigh: Secondary | ICD-10-CM | POA: Insufficient documentation

## 2023-09-28 DIAGNOSIS — E039 Hypothyroidism, unspecified: Secondary | ICD-10-CM | POA: Insufficient documentation

## 2023-09-28 DIAGNOSIS — E785 Hyperlipidemia, unspecified: Secondary | ICD-10-CM

## 2023-09-28 DIAGNOSIS — E041 Nontoxic single thyroid nodule: Secondary | ICD-10-CM | POA: Insufficient documentation

## 2023-09-28 DIAGNOSIS — E119 Type 2 diabetes mellitus without complications: Secondary | ICD-10-CM | POA: Insufficient documentation

## 2023-09-28 DIAGNOSIS — K5900 Constipation, unspecified: Secondary | ICD-10-CM | POA: Insufficient documentation

## 2023-09-28 DIAGNOSIS — M8580 Other specified disorders of bone density and structure, unspecified site: Secondary | ICD-10-CM

## 2023-09-28 DIAGNOSIS — Z0000 Encounter for general adult medical examination without abnormal findings: Secondary | ICD-10-CM | POA: Insufficient documentation

## 2023-09-28 DIAGNOSIS — Z7984 Long term (current) use of oral hypoglycemic drugs: Secondary | ICD-10-CM

## 2023-09-28 LAB — CBC WITH DIFF
BASOPHIL #: 0 x10ˆ3/uL (ref 0.00–0.10)
BASOPHIL %: 0 % (ref 0–1)
EOSINOPHIL #: 0.2 x10ˆ3/uL (ref 0.00–0.50)
EOSINOPHIL %: 3 % (ref 1–7)
HCT: 41.7 % (ref 31.2–41.9)
HGB: 13.9 g/dL (ref 10.9–14.3)
LYMPHOCYTE #: 1.6 x10ˆ3/uL (ref 1.10–3.10)
LYMPHOCYTE %: 26 % (ref 16–46)
MCH: 28.9 pg (ref 24.7–32.8)
MCHC: 33.2 g/dL (ref 32.3–35.6)
MCV: 86.9 fL (ref 75.5–95.3)
MONOCYTE #: 0.3 x10ˆ3/uL (ref 0.20–0.90)
MONOCYTE %: 5 % (ref 4–11)
MPV: 8.9 fL (ref 7.9–10.8)
NEUTROPHIL #: 3.9 x10ˆ3/uL (ref 1.90–8.20)
NEUTROPHIL %: 65 % (ref 43–77)
PLATELETS: 266 x10ˆ3/uL (ref 140–440)
RBC: 4.8 x10ˆ6/uL (ref 3.63–4.92)
RDW: 14.6 % (ref 12.3–17.7)
WBC: 6 x10ˆ3/uL (ref 3.8–11.8)

## 2023-09-28 LAB — MICROALBUMIN/CREATININE RATIO, URINE, RANDOM
CREATININE RANDOM URINE: 112 mg/dL (ref 30–125)
MICROALBUMIN RANDOM URINE: 1.2 mg/dL
MICROALBUMIN/CREATININE RATIO RANDOM URINE: 10.7 mg/g

## 2023-09-28 LAB — HGA1C (HEMOGLOBIN A1C WITH EST AVG GLUCOSE): HEMOGLOBIN A1C: 6.1 % — ABNORMAL HIGH (ref 4.0–6.0)

## 2023-09-28 MED ORDER — METFORMIN ER 500 MG TABLET,EXTENDED RELEASE 24 HR
500.0000 mg | ORAL_TABLET | Freq: Every day | ORAL | 3 refills | Status: AC
Start: 2023-09-28 — End: ?

## 2023-09-28 NOTE — Assessment & Plan Note (Addendum)
 She will have f/u thyroid US  & ENT visit in Dec.

## 2023-09-28 NOTE — Assessment & Plan Note (Addendum)
Synthroid 50mcg daily 

## 2023-09-28 NOTE — Assessment & Plan Note (Addendum)
 Vit D 26 taking MVI daily- suggest that she add vitamin D 4000 units daily  Ensure adequate calcium of 1200 mg daily

## 2023-09-28 NOTE — Assessment & Plan Note (Addendum)
 A1C 6.1%  Metformin  500 mg daily  Atorvastatin   20 mg LDL 55  GFR 79    Orders:    HGA1C (HEMOGLOBIN A1C WITH EST AVG GLUCOSE); Future    MICROALBUMIN/CREATININE RATIO, URINE, RANDOM; Future

## 2023-09-28 NOTE — Progress Notes (Signed)
 FAMILY MEDICINE, MEDICAL OFFICE BUILDING  [address]  [place] [address]  Operated by [facility]    Whitney Vang  [birth year]  [id number]    Date of Service: 09/28/2023  9:00 AM EDT    Chief complaint:   Chief Complaint   Patient presents with    Follow Up 6 Months     6 month follow up       Subjective:     This is a case of a 73 y.o. year old female who comes in today for CDM.    She will have f/u thyroid US  in Dec.     She underwent lap chole 08/04/23    Recent lab results reviewed and noted.  A1C: 6.2  A1C Date: 04/01/2023  THYROID STIMULATING HORMONE  No results found for: TSH, [id number]    ROS:  Constitutional - no appetite or weight changes, no fatigue, no fevers, chills, or night sweats  Respiratory - no dyspnea or cough  Cardiovascular - no chest pain or palpitations  Gastrointestinal - no nausea, vomiting, diarrhea, or constipation, no dyspepsia  Endocrine - no heat or cold intolerance, no polyuria, polydipsia, or polyphagia  Skin - no rashes, color changes, or lesions  Musculoskeletal - no arthralgias or myalgias  Genitourinary - no urinary frequency or dysuria, no genital discharge  Neurologic - no vision or hearing changes, no weakness, no parasthesias   Psychiatric - mood has been appropriate, no depression or anxiety    Patient Active Problem List    Diagnosis Date Noted    Cholelithiasis 07/04/2023    Thyroid nodule, uninodular 05/19/2023    Health care maintenance 03/28/2023    Type 2 diabetes mellitus without complications 03/28/2023    Hyperlipidemia 12/29/2022    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    FH: breast cancer in first degree relative 09/22/2022    Prediabetes 09/22/2022    Hypothyroidism 09/22/2022       Past Surgical History:   Procedure Laterality Date    GALLBLADDER SURGERY  08/04/2023    dr duremdes    [name]             Current Outpatient Medications   Medication Sig    atorvastatin   (LIPITOR) 20 mg Oral Tablet Take 1 Tablet (20 mg total) by mouth Once a day    cholecalciferol, vitamin D3, 25 mcg (1,000 unit) Oral Tablet Take 1 Tablet (1,000 Units total) by mouth Daily    fluticasone  propionate (FLONASE ) 50 mcg/actuation Nasal Spray, Suspension Administer 1 Spray into each nostril Daily    levothyroxine (SYNTHROID) 50 mcg Oral Tablet Take 1 Tablet (50 mcg total) by mouth Every morning    metFORMIN  (GLUCOPHAGE  XR) 500 mg Oral Tablet Sustained Release 24 hr Take 1 Tablet (500 mg total) by mouth Daily    multivit-min-iron -FA-vit K-lut (CENTRUM SILVER  WOMEN) 8 mg iron -400 mcg-50 mcg Oral Tablet Take 1 Tablet by mouth Once a day       Objective:     BP 131/77 (Site: Left Arm, Patient Position: Sitting)   Pulse 72   Ht 1.575 m (5' 2)   SpO2 96%   BMI 31.64 kg/m       BMI addressed: Advised on diet, weight loss, and exercise to reduce above normal BMI.     General appearance: alert, cooperative, in no acute distress  HEENT: PERRL;no LAD or thyromegaly, neck supple.  Lungs: clear to auscultation  bilaterally; no wheezes or rhonchi   Heart: regular rate and rhythm; normal S1 & S2; no murmur  Extremities: extremities normal ROM, no cyanosis or edema , no rash  Psych: alert and oriented x 3  Neuro: CN 2-12 grossly intact  Peripheral motor and sensory exams are grossly normal    Assessment/Plan     Assessment & Plan  Health care maintenance  She is UTD and will ck on pneumonia vaccine she took last August at pharmacy       Type 2 diabetes mellitus without complications  A1C 6.1%  Metformin  500 mg daily  Atorvastatin   20 mg LDL 55  GFR 79    Orders:    HGA1C (HEMOGLOBIN A1C WITH EST AVG GLUCOSE); Future    MICROALBUMIN/CREATININE RATIO, URINE, RANDOM; Future     Osteopenia of neck of femur  Vit D 26 taking MVI daily- suggest that she add vitamin D  4000 units daily  Ensure adequate calcium of 1200 mg daily          Hypothyroidism  Synthroid 50mcg daily          Thyroid nodule, uninodular  She will have  f/u thyroid US  & ENT visit in Dec.        Constipation, unspecified constipation type    Orders:    CBC/DIFF               Health Maintenance:     She had a pneumonia vaccine last August at pharmacy ? Prevnar 15 she will ck w/ pharmacy              The patient was given the opportunity to ask questions and those questions were answered to the patient's satisfaction. The patient was encouraged to call with any additional questions or concerns. Instructed patient to call back if symptoms worse.   Discussed with patient effects and side effects of medications. Medication safety was discussed. A copy of the patient's medication list was printed and given to the patient. A good faith effort was made to reconcile the patient's medications.       Follow up: Return in about 5 months (around 03/08/2024).    Calhoun LITTIE Na, MD

## 2023-09-28 NOTE — Assessment & Plan Note (Addendum)
 She is UTD and will ck on pneumonia vaccine she took last August at pharmacy

## 2023-10-05 ENCOUNTER — Telehealth (INDEPENDENT_AMBULATORY_CARE_PROVIDER_SITE_OTHER): Payer: Self-pay | Admitting: Internal Medicine

## 2023-10-05 NOTE — Telephone Encounter (Signed)
 lease call pt with message.  Also I received a note saying she got a pneumonia shot but I still do not know which one she had as it only had numbers [id number] and I do not know what that means.  So still need to know if she had a Pneumovax 23 or one of the Prevnars.         Spoke with pt. Voiced understanding, pt. Got the pneumovax 23          Previous Messages       ----- Message -----  From: Layman Calhoun CROME, MD  Sent: 09/28/2023  To: Rosalynn Best D  Subject: Unread Message Notification                          This message is to inform you that the patient has not yet read the following message. (Notification date: October 05, 2023)      lab results    From  Calhoun CROME Layman, MD To  Elnore, Cosens and Delivered  09/28/2023  3:22 PM         Your A1C again shows that your average blood sugar level is holding steady in the prediabetes range.     We will continue your one Metformin  daily.      Keep up the good work!

## 2024-02-07 ENCOUNTER — Other Ambulatory Visit: Payer: Self-pay

## 2024-02-07 ENCOUNTER — Ambulatory Visit
Admission: RE | Admit: 2024-02-07 | Discharge: 2024-02-07 | Disposition: A | Payer: Self-pay | Source: Ambulatory Visit | Attending: OTOLARYNGOLOGY

## 2024-02-07 DIAGNOSIS — E041 Nontoxic single thyroid nodule: Secondary | ICD-10-CM

## 2024-02-07 DIAGNOSIS — E042 Nontoxic multinodular goiter: Secondary | ICD-10-CM

## 2024-02-09 ENCOUNTER — Ambulatory Visit (INDEPENDENT_AMBULATORY_CARE_PROVIDER_SITE_OTHER): Payer: Self-pay | Admitting: OTOLARYNGOLOGY

## 2024-02-13 ENCOUNTER — Other Ambulatory Visit: Payer: Self-pay

## 2024-02-13 ENCOUNTER — Ambulatory Visit: Payer: Self-pay | Admitting: OTOLARYNGOLOGY

## 2024-02-13 ENCOUNTER — Encounter (INDEPENDENT_AMBULATORY_CARE_PROVIDER_SITE_OTHER): Payer: Self-pay | Admitting: OTOLARYNGOLOGY

## 2024-02-13 VITALS — Ht 62.0 in | Wt 173.0 lb

## 2024-02-13 DIAGNOSIS — E041 Nontoxic single thyroid nodule: Secondary | ICD-10-CM | POA: Insufficient documentation

## 2024-02-13 DIAGNOSIS — H6993 Unspecified Eustachian tube disorder, bilateral: Secondary | ICD-10-CM | POA: Insufficient documentation

## 2024-02-13 DIAGNOSIS — H905 Unspecified sensorineural hearing loss: Secondary | ICD-10-CM | POA: Insufficient documentation

## 2024-02-13 NOTE — Procedures (Signed)
 [facility]  [address]  [place] [address]  Operated by [facility]  Procedure Note    Name: Whitney Vang MRN:  [id number]   Date: 02/13/2024 DOB:  [birth year] (73 y.o.)         31575 - LARYNGOSCOPY, FLEXIBLE DIAGNOSTIC (AMB ONLY)    Performed by: Margean Anes, DO  Authorized by: Margean Anes, DO    Time Out:     Immediately before the procedure, a time out was called:  Yes    Patient verified:  Yes    Procedure Verified:  Yes    Site Verified:  Yes  Documentation:      [facility]  [address]  [place] [address]  Operated by [facility]  Procedure Note    Name: Whitney Vang MRN:  [id number]  Date: 02/13/2024 DOB:  15-Feb-1951 (73 y.o.)        @PROCDOC @    Indications for procedure: Dysphagia    Anesthesia: Oxymetazoline nasal spray    Description: The flexible endoscope was gently introduced into the nostril and passed along the floor of the nose to the nasopharynx. Adenoid was minimal and eustachian tubes normal. The retropalatal airway was patent.    The endoscope was passed to the oropharynx. Base of tongue displayed normal lingual tonsils, patent valelulla, and sharply defined upright epiglottis. Retrolingual airway was patent.    The larynx displayed normal true vocal cords with good mobility. False cords were normal. Arytenoid mucosa was pink with no edema.     The piriform recesses were symmetric without secretion. The hypopharynx was symmetric without lesion.    Findings: Laryngopharyngeal Reflux    The patient tolerated the procedure well.    Anes Margean, DO                 Aniello Christopoulos [place], DO

## 2024-02-13 NOTE — H&P (Signed)
 [facility]  [address]  [place] [address]  Operated by [facility]  Return Patient Visit    Name: Whitney Vang MRN:  [id number]   Date: 02/13/2024 DOB: [birth year] (73 y.o.)       Referring Provider:  Layman Calhoun CROME, MD    Reason for Visit:   Chief Complaint   Patient presents with    Follow-up After Testing     Rc after thyroid  US        History of Present Illness:  Whitney Vang is a 73 y.o. female who is FU after thyroid  US . Results are below. Patient states she has been having some dysphagia and reflux. She states hearing is stable and otalgia has resolved.    PACS Images     Show images for US  THYROID     Imaging Services, [facility]   [address]  [place] [address]   Phone: [phone] Fax: [phone]     PATIENT NAME: Whitney, Vang MED REC NO: [id number]   BIRTH DATE: 11/02/1950 ORDER: [id number]   SEX: F ORDER FROM: [facility]   REQUESTING PHYS: Margean Anes SERVICE DATE: 02/07/2024 15:20    ATTENDING PHYS: Margean Anes REPORT DATE: 02/07/2024 15:43   REASON: Thyroid  nodule       Final  US  THYROID                           Study Result    Narrative & Impression   Whitney Vang     RADIOLOGIST: Lynwood CROME Shoemaker, MD     US  THYROID  performed on 02/07/2024 3:20 PM     CLINICAL HISTORY:   E04.1: Thyroid  nodule.  thyroid  nodule     TECHNIQUE: Thyroid  ultrasound     COMPARISON:  None.     FINDINGS:  Right thyroid  lobe measures 5.3 cm x 1.5 cm x 1.9 cm.  Left thyroid  lobe measures 3.5 cm x 1.1 cm x 1.4 cm.   Isthmus thickness is 0.2 cm.      Thyroid  Size: Normal       Background Echotexture: Heterogeneous     Thyroid  Nodules:  Possible right thyroid  nodule measuring about 6.7 mm although is not well-defined given the overall heterogeneity of the background of the thyroid  lobe.     IMPRESSION:  Heterogeneous thyroid . Possible right thyroid  nodule. 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 Follow-up ultrasound is recommended in one year.     Underlying thyroiditis should  be considered.              Radiologist location ID: [id number]         Patient History:  Problem List[1]    Current Outpatient Medications   Medication Sig    atorvastatin  (LIPITOR) 20 mg Oral Tablet Take 1 Tablet (20 mg total) by mouth Once a day    cholecalciferol, vitamin D3, 25 mcg (1,000 unit) Oral Tablet Take 1 Tablet (1,000 Units total) by mouth Daily    fluticasone  propionate (FLONASE ) 50 mcg/actuation Nasal Spray, Suspension Administer 1 Spray into each nostril Daily    levothyroxine (SYNTHROID) 50 mcg Oral Tablet Take 1 Tablet (50 mcg total) by mouth Every morning    metFORMIN  (GLUCOPHAGE  XR) 500 mg Oral Tablet Sustained Release 24 hr Take 1 Tablet (500 mg total) by mouth Daily    multivit-min-iron -FA-vit K-lut (CENTRUM SILVER  WOMEN) 8 mg iron -400 mcg-50 mcg Oral Tablet Take 1 Tablet by mouth Once a day  Allergies[2]  Past Medical History:   Diagnosis Date    Constipation     Does use hearing aid     Esophageal reflux     H/O hearing loss     Hyperlipidemia     Hypothyroidism     Prediabetes     Spinal stenosis     [facility]    Spinal stenosis 09/29/2021    I was diagnosed in the fall.  I lost some of the use of my left arm.  I went to [facility].  Therapy in [place] lasted from October 2023 to January 2024.      Type 2 diabetes mellitus       Past Surgical History:   Procedure Laterality Date    GALLBLADDER SURGERY  08/04/2023    dr duremdes    [name]        Family Medical History:       Problem Relation (Age of Onset)    Bone cancer Mother    Breast Cancer Mother    Diabetes Brother    Diabetes type II Mother, Brother, Brother, Brother, Brother    Heart Disease Mother, Father, Brother, Brother, Brother, Brother    Hypertension (High Blood Pressure) Sister    Irregular heart beat Sister    No Known Problems Maternal Aunt, Maternal Uncle, Paternal Aunt, Paternal Uncle, Maternal Grandmother, Maternal Grandfather, Paternal Grandmother, Paternal Grandfather, Daughter, Son, Other     Pacemaker Brother            Social History[3]    Review of Systems:  Review of Systems    Physical Exam:  Ht 1.575 m (5' 2)   Wt 78.5 kg (173 lb)   BMI 31.64 kg/m       ENT Physical Exam  Constitutional  Appearance: patient appears well-developed, well-nourished and well-groomed,  Communication/Voice: communication appropriate for developmental age; vocal quality normal;  Head and Face  Appearance: head appears normal, face appears normal and face appears atraumatic;  Palpation: facial palpation normal;  Salivary: glands normal;  Ear  Hearing: intact;  Auricles: right auricle normal; left auricle normal;  External Mastoids: right external mastoid normal; left external mastoid normal;  Ear Canals: right ear canal normal; left ear canal normal;  Tympanic Membranes: right tympanic membrane normal; left tympanic membrane normal;  Nose  External Nose: nares patent bilaterally; external nose normal;  Internal Nose: nasal mucosa normal; septum normal; bilateral inferior turbinates normal;  Oral Cavity/Oropharynx  Lips: normal;  Teeth: normal;  Gums: gingiva normal;  Tongue: normal;  Oral mucosa: normal;  Hard palate: normal;  Neck  Neck: neck normal; neck palpation normal;  Thyroid : thyroid  normal;  Respiratory  Inspection: breathing unlabored; normal breathing rate;  Lymphatic  Palpation: lymph nodes normal;  Neurovestibular  Mental Status: alert and oriented;  Psychiatric: mood normal; affect is appropriate;  Cranial Nerves: cranial nerves intact;         Assessment:  ENCOUNTER DIAGNOSES     ICD-10-CM   1. Thyroid  nodule  E04.1   2. ETD (Eustachian tube dysfunction), bilateral  H69.93   3. 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  Will check MRI attn IAC for asymmetric hearing loss.  US  reviewed and will repeat in 1 year.   Continue Flonase  for chronic ETD  Orders Placed This Encounter    31575 - LARYNGOSCOPY, FLEXIBLE DIAGNOSTIC (AMB ONLY)    MRI IAC W/WO  CONTRAST  Return for Follow up after MRI.    .           [1]   Patient Active Problem List  Diagnosis    Cervical stenosis of spine    FH: breast cancer in first degree relative    Prediabetes    Hypothyroidism    Osteopenia of neck of femur    Hyperlipidemia    Health care maintenance    Type 2 diabetes mellitus without complications    Thyroid  nodule, uninodular    Cholelithiasis   [2] No Known Allergies  [3]   Social History  Tobacco Use    Smoking status: Never    Smokeless tobacco: Never   Vaping Use    Vaping status: Never Used   Substance Use Topics    Alcohol  use: Not Currently    Drug use: Not Currently

## 2024-02-13 NOTE — Addendum Note (Signed)
 Addended by: Devina Bezold C on: 02/13/2024 02:32 PM     Modules accepted: Orders

## 2024-02-14 ENCOUNTER — Other Ambulatory Visit (INDEPENDENT_AMBULATORY_CARE_PROVIDER_SITE_OTHER): Payer: Self-pay | Admitting: OTOLARYNGOLOGY

## 2024-02-14 DIAGNOSIS — E119 Type 2 diabetes mellitus without complications: Secondary | ICD-10-CM

## 2024-02-20 ENCOUNTER — Ambulatory Visit: Attending: OTOLARYNGOLOGY

## 2024-02-20 ENCOUNTER — Other Ambulatory Visit: Payer: Self-pay

## 2024-02-20 DIAGNOSIS — E119 Type 2 diabetes mellitus without complications: Secondary | ICD-10-CM | POA: Insufficient documentation

## 2024-02-20 LAB — CREATININE WITH EGFR
CREATININE: 0.85 mg/dL (ref 0.60–1.30)
ESTIMATED GFR: 72 mL/min/1.73mˆ2 (ref >59)

## 2024-02-28 ENCOUNTER — Other Ambulatory Visit: Payer: Self-pay

## 2024-02-28 ENCOUNTER — Encounter (INDEPENDENT_AMBULATORY_CARE_PROVIDER_SITE_OTHER): Payer: Self-pay | Admitting: Internal Medicine

## 2024-02-28 ENCOUNTER — Ambulatory Visit: Payer: Self-pay | Attending: Internal Medicine | Admitting: Internal Medicine

## 2024-02-28 DIAGNOSIS — Z7984 Long term (current) use of oral hypoglycemic drugs: Secondary | ICD-10-CM

## 2024-02-28 DIAGNOSIS — E039 Hypothyroidism, unspecified: Secondary | ICD-10-CM | POA: Insufficient documentation

## 2024-02-28 DIAGNOSIS — E785 Hyperlipidemia, unspecified: Secondary | ICD-10-CM | POA: Insufficient documentation

## 2024-02-28 DIAGNOSIS — R109 Unspecified abdominal pain: Secondary | ICD-10-CM | POA: Insufficient documentation

## 2024-02-28 DIAGNOSIS — E119 Type 2 diabetes mellitus without complications: Secondary | ICD-10-CM | POA: Insufficient documentation

## 2024-02-28 DIAGNOSIS — Z0000 Encounter for general adult medical examination without abnormal findings: Secondary | ICD-10-CM | POA: Insufficient documentation

## 2024-02-28 DIAGNOSIS — E559 Vitamin D deficiency, unspecified: Secondary | ICD-10-CM | POA: Insufficient documentation

## 2024-02-28 DIAGNOSIS — R079 Chest pain, unspecified: Secondary | ICD-10-CM | POA: Insufficient documentation

## 2024-02-28 LAB — COMPREHENSIVE METABOLIC PNL, FASTING
ALBUMIN/GLOBULIN RATIO: 1.3 (ref 0.8–1.4)
ALBUMIN: 4.3 g/dL (ref 3.5–5.7)
ALKALINE PHOSPHATASE: 72 U/L (ref 34–104)
ALT (SGPT): 17 U/L (ref 7–52)
ANION GAP: 9 mmol/L (ref 4–13)
AST (SGOT): 22 U/L (ref 13–39)
BILIRUBIN TOTAL: 0.4 mg/dL (ref 0.3–1.0)
BUN/CREA RATIO: 19 (ref 6–22)
BUN: 15 mg/dL (ref 7–25)
CALCIUM, CORRECTED: 9.5 mg/dL (ref 8.9–10.8)
CALCIUM: 9.7 mg/dL (ref 8.6–10.3)
CHLORIDE: 107 mmol/L (ref 98–107)
CO2 TOTAL: 27 mmol/L (ref 21–31)
CREATININE: 0.77 mg/dL (ref 0.60–1.30)
ESTIMATED GFR: 81 mL/min/1.73mˆ2 (ref >59)
GLOBULIN: 3.4 (ref 2.0–3.5)
GLUCOSE: 98 mg/dL (ref 74–109)
OSMOLALITY, CALCULATED: 286 mosm/kg (ref 270–290)
POTASSIUM: 4.4 mmol/L (ref 3.5–5.1)
PROTEIN TOTAL: 7.7 g/dL (ref 6.4–8.9)
SODIUM: 143 mmol/L (ref 136–145)

## 2024-02-28 LAB — LIPID PANEL
CHOL/HDL RATIO: 2.4
CHOLESTEROL: 110 mg/dL (ref <200)
HDL CHOL: 45 mg/dL (ref >=40)
LDL CALC: 52 mg/dL (ref 0–100)
TRIGLYCERIDES: 63 mg/dL (ref <=150)
VLDL CALC: 13 mg/dL (ref 0–50)

## 2024-02-28 LAB — VITAMIN D 25 TOTAL: VITAMIN D 25, TOTAL: 33.08 ng/mL (ref 30.00–100.00)

## 2024-02-28 LAB — THYROID STIMULATING HORMONE (SENSITIVE TSH): TSH: 2.785 u[IU]/mL (ref 0.450–5.330)

## 2024-02-28 NOTE — Progress Notes (Signed)
 [facility]  [address]  [place] [address]  Operated by [facility]    Whitney Vang  November 29, 1950  [id number]    Date of Service: 02/28/2024  9:30 AM EST    Chief complaint:   Chief Complaint   Patient presents with    Follow Up     Patient states that she has had an ache in her right side which radiates to her back x 3-4 months    Patient states that she had blood work 1 week ago to check her kidney function.    Patient states that 1 week ago she had major pain in her chest and her right shoulder was hurting. Patient states that she had ate brown beans and onions for dinner.        Subjective:     This is a case of a 74 y.o. year old female who comes in today for CDM.    She had cholecystectomy earlier this year.  She has had this since her GB surgery and it is continuous.  She has a BM each AM.  There are no inciting or relieving factors.  It does not awaken her at night and if she is busy during the day then she does not notice it.  It is mostly RUQ.    She has left shoulder pain off and on and she noted this and center of upper chest cp after eating brown beans.  It lasted about 20 secs she took some Tums which seemed to help.  She has been to exercise class after than and she did some spring cleaning and she has not had any recurrence.      Recent lab results reviewed and noted.  A1C: 6.1  A1C Date: 09/28/2023  COMPLETE BLOOD COUNT   Lab Results   Component Value Date    WBC 6.0 09/28/2023    HGB 13.9 09/28/2023    HCT 41.7 09/28/2023    PLTCNT 266 09/28/2023       DIFFERENTIAL  Lab Results   Component Value Date    PMNS 65 09/28/2023    LYMPHOCYTES 26 09/28/2023    MONOCYTES 5 09/28/2023    EOSINOPHIL 3 09/28/2023    BASOPHILS 0 09/28/2023    BASOPHILS 0.00 09/28/2023    PMNABS 3.90 09/28/2023    LYMPHSABS 1.60 09/28/2023    EOSABS 0.20 09/28/2023    MONOSABS 0.30 09/28/2023     COMPREHENSIVE METABOLIC PANEL  Lab Results   Component Value Date    SODIUM 142  07/26/2023    POTASSIUM 4.0 07/26/2023    CHLORIDE 105 07/26/2023    CO2 30 07/26/2023    ANIONGAP 7 07/26/2023    BUN 18 07/26/2023    CREATININE 0.85 02/20/2024    GLUCOSENF 92 07/26/2023    CALCIUM 9.5 07/26/2023    ALBUMIN 4.3 07/26/2023    TOTALPROTEIN 7.4 07/26/2023    ALKPHOS 73 07/26/2023    AST 23 07/26/2023    ALT 17 07/26/2023    GFR 72 02/20/2024             LIPID PROFILE  Lab Results   Component Value Date    CHOLESTEROL 112 12/22/2022    HDLCHOL 42 12/22/2022    LDLCHOL 55 12/22/2022    TRIG 76 12/22/2022     Lab Results   Component Value Date    VITAMIND25 26.38 (L) 12/22/2022        ROS:  Constitutional -  no appetite or weight changes, no fatigue, no fevers, chills, or night sweats  Respiratory - no dyspnea or cough  Cardiovascular - no chest pain or palpitations  Gastrointestinal - no nausea, vomiting, diarrhea, or constipation, no dyspepsia  Endocrine - no heat or cold intolerance, no polyuria, polydipsia, or polyphagia  Skin - no rashes, color changes, or lesions  Musculoskeletal - no arthralgias or myalgias  Genitourinary - no urinary frequency or dysuria, no genital discharge  Neurologic - no vision or hearing changes, no weakness, no parasthesias   Psychiatric - mood has been appropriate, no depression or anxiety    Patient Active Problem List    Diagnosis Date Noted    Cholelithiasis 07/04/2023    Thyroid  nodule, uninodular 05/19/2023    Health care maintenance 03/28/2023    Type 2 diabetes mellitus without complications 03/28/2023    Hyperlipidemia 12/29/2022    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    FH: breast cancer in first degree relative 09/22/2022    Prediabetes 09/22/2022    Hypothyroidism 09/22/2022       Past Surgical History:   Procedure Laterality Date    GALLBLADDER SURGERY  08/04/2023    dr duremdes    [name]             Current Outpatient Medications   Medication Sig    atorvastatin  (LIPITOR) 20 mg Oral Tablet Take  1 Tablet (20 mg total) by mouth Once a day    cholecalciferol, vitamin D3, 25 mcg (1,000 unit) Oral Tablet Take 1 Tablet (1,000 Units total) by mouth Daily    fluticasone  propionate (FLONASE ) 50 mcg/actuation Nasal Spray, Suspension Administer 1 Spray into each nostril Daily    levothyroxine (SYNTHROID) 50 mcg Oral Tablet Take 1 Tablet (50 mcg total) by mouth Every morning    metFORMIN  (GLUCOPHAGE  XR) 500 mg Oral Tablet Sustained Release 24 hr Take 1 Tablet (500 mg total) by mouth Daily    multivit-min-iron -FA-vit K-lut (CENTRUM SILVER  WOMEN) 8 mg iron -400 mcg-50 mcg Oral Tablet Take 1 Tablet by mouth Once a day       Objective:     BP 134/71 (Site: Right Arm, Patient Position: Sitting, Cuff Size: Adult)   Pulse 70   Temp 36.6 C (97.9 F) (Temporal)   Resp 18   Ht 1.575 m (5' 2)   Wt 81.1 kg (178 lb 12.8 oz)   SpO2 98%   BMI 32.70 kg/m       BMI addressed: Intervention for BMI inappropriate due to age over 40 and comorbidities.     General appearance: alert, cooperative, in no acute distress  Lungs: clear to auscultation bilaterally; no wheezes or rhonchi   Heart: regular rate and rhythm; normal S1 & S2; no murmur  Abdomen: soft, non-tender, not distended; bowel sounds present.  No palpable masses.  Extremities: extremities normal ROM, no cyanosis or edema , no rash  Psych: alert and oriented x 3  Neuro: CN 2-12 grossly intact  Peripheral motor and sensory exams are grossly normal    Assessment/Plan     Assessment & Plan  Hyperlipidemia  LDL 55 much improved on Atorvastatin  20 mg daily    Orders:    LIPID PANEL; Future     Hypothyroidism    Orders:    THYROID  STIMULATING HORMONE (SENSITIVE TSH); Future    THYROPEROXIDASE (TPO) ANTIBODIES, SERUM; Future  IMPRESSION:  Heterogeneous thyroid . Possible right thyroid  nodule. This would be  a TR 4 nodule. Follow-up ultrasound is recommended in one year.     Underlying thyroiditis should be considered.  Type 2 diabetes mellitus without complications  A1C  6.1%  Metformin  500 mg daily  Atorvastatin   20 mg LDL 55  GFR 72    Orders:    HGA1C (HEMOGLOBIN A1C WITH EST AVG GLUCOSE); Future     Vitamin D  deficiency  Level of 26 2024  Orders:    VITAMIN D  25 TOTAL; Future    Health care maintenance  DEXA 10/24-[facility]  Mammo- 05/25- [facility]  Colon 2023 for 10 yr f/u  Cologuard neg 05/23         Abdominal pain, unspecified abdominal location  Anticipate ordering CT abd/pelvis w/ oral contrast if no clues in labs  Orders:    COMPREHENSIVE METABOLIC PNL, FASTING; Future    Chest pain, unspecified type  EKG:  NSR  65 BMP.  WNL and NAD.  Pt will report recurrent cp sxs esp w/ exertion  Orders:    EKG (93000) - SAME DAY - PERFORMED IN OFFICE; Future               Health Maintenance:   Depression screening is negative. PHQ 2 Total: 0                   The patient was given the opportunity to ask questions and those questions were answered to the patient's satisfaction. The patient was encouraged to call with any additional questions or concerns. Instructed patient to call back if symptoms worse.   Discussed with patient effects and side effects of medications. Medication safety was discussed. A copy of the patient's medication list was printed and given to the patient. A good faith effort was made to reconcile the patient's medications.       Follow up: Return in about 6 weeks (around 04/10/2024).    Calhoun LITTIE Na, MD

## 2024-02-28 NOTE — Assessment & Plan Note (Addendum)
 LDL 55 much improved on Atorvastatin  20 mg daily    Orders:    LIPID PANEL; Future

## 2024-02-28 NOTE — Assessment & Plan Note (Addendum)
 DEXA 10/24-[facility]  Mammo- 05/25- [facility]  Colon 2023 for 10 yr f/u  Cologuard neg 05/23

## 2024-02-28 NOTE — Nursing Note (Signed)
 02/28/24 0930   Domestic Violence   Because we are aware of abuse and domestic violence today, we ask all patients: Are you being hurt, hit, or frightened by anyone at your home or in your life?  N   Basic Needs   Do you have any basic needs within your home that are not being met? (such as Food, Shelter, Museum/gallery Curator, paying for bills and/or medications) N

## 2024-02-28 NOTE — Assessment & Plan Note (Addendum)
 A1C 6.1%  Metformin  500 mg daily  Atorvastatin   20 mg LDL 55  GFR 72    Orders:    HGA1C (HEMOGLOBIN A1C WITH EST AVG GLUCOSE); Future

## 2024-02-28 NOTE — Nursing Note (Signed)
 02/28/24 0930   Depression Screen   Little interest or pleasure in doing things. 0   Feeling down, depressed, or hopeless 0   PHQ 2 Total 0

## 2024-02-28 NOTE — Assessment & Plan Note (Addendum)
 Orders:    THYROID  STIMULATING HORMONE (SENSITIVE TSH); Future    THYROPEROXIDASE (TPO) ANTIBODIES, SERUM; Future  IMPRESSION:  Heterogeneous thyroid . Possible right thyroid  nodule. 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 Follow-up ultrasound is recommended in one year.     Underlying thyroiditis should be considered.

## 2024-02-29 LAB — THYROPEROXIDASE (TPO) ANTIBODIES, SERUM: ANTI THYROPEROXIDASE ANTIBODIES: 322 [IU]/mL — ABNORMAL HIGH (ref <=33)

## 2024-03-01 ENCOUNTER — Other Ambulatory Visit (INDEPENDENT_AMBULATORY_CARE_PROVIDER_SITE_OTHER): Payer: Self-pay | Admitting: Internal Medicine

## 2024-03-01 ENCOUNTER — Ambulatory Visit (INDEPENDENT_AMBULATORY_CARE_PROVIDER_SITE_OTHER): Payer: Self-pay | Admitting: Internal Medicine

## 2024-03-01 DIAGNOSIS — R1011 Right upper quadrant pain: Secondary | ICD-10-CM

## 2024-03-09 ENCOUNTER — Telehealth (INDEPENDENT_AMBULATORY_CARE_PROVIDER_SITE_OTHER): Payer: Self-pay | Admitting: Internal Medicine

## 2024-03-09 ENCOUNTER — Other Ambulatory Visit (INDEPENDENT_AMBULATORY_CARE_PROVIDER_SITE_OTHER): Payer: Self-pay

## 2024-03-09 DIAGNOSIS — H905 Unspecified sensorineural hearing loss: Secondary | ICD-10-CM

## 2024-03-09 NOTE — Telephone Encounter (Signed)
 Please call pt with message.  CT is scheduled in February acc. To chart.     Spoke with pt. She voiced understanding, she stated is taking levothyroxine 50 mcg daily.            Previous Messages       ----- Message -----  From: Layman Calhoun CROME, MD  Sent: 03/01/2024  To: Whitney Vang  Subject: Unread Message Notification                          This message is to inform you that the patient has not yet read the following message. (Notification date: March 08, 2024)      lab results    From  Calhoun CROME Layman, MD To  Whitney, Vang and Delivered  03/01/2024  2:15 PM         Your labs look okay and no abnormalities to explain the discomfort in the right upper abdomen.     I have put an order in for a CT scan of your abdomen and the hospital will call you to schedule.  I did order it with oral contrast to outline the bowel.        The only abnormality on the blood work was elevated thyroid  antibodies.  This is when the body makes antibodies against it's own tissue. No known reason for that!  You take thyroid  medication to prevent being underactive and your thyroid  is monitored so nothing else needs to be done for that.

## 2024-03-22 ENCOUNTER — Encounter (INDEPENDENT_AMBULATORY_CARE_PROVIDER_SITE_OTHER): Payer: Self-pay | Admitting: OTOLARYNGOLOGY

## 2024-03-22 ENCOUNTER — Other Ambulatory Visit (INDEPENDENT_AMBULATORY_CARE_PROVIDER_SITE_OTHER): Payer: Self-pay | Admitting: Internal Medicine

## 2024-03-22 ENCOUNTER — Ambulatory Visit: Payer: Self-pay | Attending: OTOLARYNGOLOGY | Admitting: OTOLARYNGOLOGY

## 2024-03-22 ENCOUNTER — Other Ambulatory Visit: Payer: Self-pay

## 2024-03-22 VITALS — Ht 62.0 in | Wt 178.0 lb

## 2024-03-22 DIAGNOSIS — E041 Nontoxic single thyroid nodule: Secondary | ICD-10-CM | POA: Insufficient documentation

## 2024-03-22 DIAGNOSIS — H905 Unspecified sensorineural hearing loss: Secondary | ICD-10-CM | POA: Insufficient documentation

## 2024-03-22 DIAGNOSIS — H903 Sensorineural hearing loss, bilateral: Secondary | ICD-10-CM

## 2024-03-22 DIAGNOSIS — R0982 Postnasal drip: Secondary | ICD-10-CM | POA: Insufficient documentation

## 2024-03-22 DIAGNOSIS — J309 Allergic rhinitis, unspecified: Secondary | ICD-10-CM

## 2024-03-22 DIAGNOSIS — H6993 Unspecified Eustachian tube disorder, bilateral: Secondary | ICD-10-CM | POA: Insufficient documentation

## 2024-03-22 MED ORDER — LEVOTHYROXINE 50 MCG TABLET: 50.0000 ug | ORAL_TABLET | Freq: Every morning | ORAL | 1 refills | Status: AC

## 2024-03-22 MED ORDER — FLUTICASONE PROPIONATE 50 MCG/ACTUATION NASAL SPRAY,SUSPENSION: 1.0000 | Freq: Every day | NASAL | 3 refills | Status: AC

## 2024-03-22 NOTE — H&P (Signed)
 ENT, [facility]  [address]  [place] [address]  Operated by [facility]  Return Patient Visit    Name: Whitney Vang MRN:  [id number]   Date: 03/22/2024 DOB: [birth year] (73 y.o.)       Referring Provider:  Layman Calhoun CROME, MD    Reason for Visit:   Chief Complaint   Patient presents with    Follow-up After Testing     Rc after MRI       History of Present Illness:  Whitney Vang is a 74 y.o. female who is FU on asymmetric SNHL, here to review the Brain MRI. Hearing as been stable.  She states allergies are controlled.     Brain MRI-- no IAC mass/lesion    Patient History:  Problem List[1]    Current Outpatient Medications   Medication Sig    atorvastatin  (LIPITOR) 20 mg Oral Tablet Take 1 Tablet (20 mg total) by mouth Once a day    cholecalciferol, vitamin D3, 25 mcg (1,000 unit) Oral Tablet Take 1 Tablet (1,000 Units total) by mouth Daily    fluticasone  propionate (FLONASE ) 50 mcg/actuation Nasal Spray, Suspension Administer 1 Spray into each nostril Daily    levothyroxine  (SYNTHROID) 50 mcg Oral Tablet Take 1 Tablet (50 mcg total) by mouth Every morning    metFORMIN  (GLUCOPHAGE  XR) 500 mg Oral Tablet Sustained Release 24 hr Take 1 Tablet (500 mg total) by mouth Daily    multivit-min-iron -FA-vit K-lut (CENTRUM SILVER  WOMEN) 8 mg iron -400 mcg-50 mcg Oral Tablet Take 1 Tablet by mouth Once a day      Allergies[2]  Past Medical History:   Diagnosis Date    Constipation     Does use hearing aid     Esophageal reflux     H/O hearing loss     Hyperlipidemia     Hypothyroidism     Prediabetes     Spinal stenosis     [facility]    Spinal stenosis 09/29/2021    I was diagnosed in the fall.  I lost some of the use of my left arm.  I went to [facility].  Therapy in [place] lasted from October 2023 to January 2024.      Type 2 diabetes mellitus       Past Surgical History:   Procedure Laterality Date    GALLBLADDER SURGERY  08/04/2023    dr duremdes    [name]         Family Medical History:       Problem Relation (Age of Onset)    Bone cancer Mother    Breast Cancer Mother    Diabetes Brother    Diabetes type II Mother, Brother, Brother, Brother, Brother    Heart Disease Mother, Father, Brother, Brother, Brother, Brother    Hypertension (High Blood Pressure) Sister    Irregular heart beat Sister    No Known Problems Maternal Aunt, Maternal Uncle, Paternal Aunt, Paternal Uncle, Maternal Grandmother, Maternal Grandfather, Paternal Grandmother, Paternal Grandfather, Daughter, Son, Other    Pacemaker Brother            Social History[3]    Review of Systems:  Review of Systems    Physical Exam:  Ht 1.575 m (5' 2)   Wt 80.7 kg (178 lb)   BMI 32.56 kg/m       ENT Physical Exam  Constitutional  Appearance: patient appears well-developed, well-nourished and well-groomed,  Communication/Voice: communication appropriate for developmental  age; vocal quality normal;  Head and Face  Appearance: head appears normal, face appears normal and face appears atraumatic;  Palpation: facial palpation normal;  Salivary: glands normal;  Ear  Hearing: intact;  Auricles: right auricle normal; left auricle normal;  External Mastoids: right external mastoid normal; left external mastoid normal;  Ear Canals: right ear canal normal; left ear canal normal;  Tympanic Membranes: right tympanic membrane normal; left tympanic membrane normal;  Nose  External Nose: nares patent bilaterally; external nose normal;  Internal Nose: nasal mucosa normal; septum normal; bilateral inferior turbinates normal;  Oral Cavity/Oropharynx  Lips: normal;  Teeth: normal;  Gums: gingiva normal;  Tongue: normal;  Oral mucosa: normal;  Hard palate: normal;  Neck  Neck: neck normal; neck palpation normal;  Thyroid : thyroid  normal;  Respiratory  Inspection: breathing unlabored; normal breathing rate;  Lymphatic  Palpation: lymph nodes normal;  Neurovestibular  Mental Status: alert and oriented;  Psychiatric: mood normal; affect  is appropriate;  Cranial Nerves: cranial nerves intact;         Assessment:  ENCOUNTER DIAGNOSES     ICD-10-CM   1. Thyroid  nodule  E04.1   2. ETD (Eustachian tube dysfunction), bilateral  H69.93   3. 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 Post-nasal drip  R09.82       Plan:  Medical records reviewed on 03/22/2024.  Reviewed Brain MRI-- no IAC mass/lesion. Repeat Thyroid  US  due 01/2025. Refilled Flonase  for chronic allergic rhinitis.     Orders Placed This Encounter    fluticasone  propionate (FLONASE ) 50 mcg/actuation Nasal Spray, Suspension      Return for Follow up after thyroid  US . SABRA Barnie SHAUNNA Irving, PA-C  The advanced practice clinician's documentation was reviewed/amended in its entirety with the assessment and plan portion completely performed independently by me during this separate encounter.          [1]   Patient Active Problem List  Diagnosis    Cervical stenosis of spine    FH: breast cancer in first degree relative    Prediabetes    Hypothyroidism    Osteopenia of neck of femur    Hyperlipidemia    Health care maintenance    Type 2 diabetes mellitus without complications    Thyroid  nodule, uninodular    Cholelithiasis   [2] No Known Allergies  [3]   Social History  Tobacco Use    Smoking status: Never    Smokeless tobacco: Never   Vaping Use    Vaping status: Never Used   Substance Use Topics    Alcohol  use: Not Currently    Drug use: Not Currently

## 2024-04-10 ENCOUNTER — Ambulatory Visit (INDEPENDENT_AMBULATORY_CARE_PROVIDER_SITE_OTHER): Payer: Self-pay | Admitting: Internal Medicine

## 2024-04-16 ENCOUNTER — Ambulatory Visit

## 2024-04-23 ENCOUNTER — Ambulatory Visit (INDEPENDENT_AMBULATORY_CARE_PROVIDER_SITE_OTHER): Admitting: Internal Medicine

## 2024-06-27 ENCOUNTER — Ambulatory Visit (HOSPITAL_COMMUNITY): Payer: Self-pay

## 2025-02-21 ENCOUNTER — Ambulatory Visit (INDEPENDENT_AMBULATORY_CARE_PROVIDER_SITE_OTHER): Payer: Self-pay | Admitting: OTOLARYNGOLOGY
